# Patient Record
Sex: Male | Born: 1990 | Race: Black or African American | Hispanic: No | Marital: Single | Smoking: Current every day smoker
Health system: Southern US, Community
[De-identification: ages and names within clinical notes are randomized; demographics above are authoritative.]

## PROBLEM LIST (undated history)

## (undated) DIAGNOSIS — A539 Syphilis, unspecified: Secondary | ICD-10-CM

## (undated) DIAGNOSIS — F909 Attention-deficit hyperactivity disorder, unspecified type: Secondary | ICD-10-CM

## (undated) DIAGNOSIS — G43909 Migraine, unspecified, not intractable, without status migrainosus: Secondary | ICD-10-CM

## (undated) HISTORY — DX: Attention-deficit hyperactivity disorder, unspecified type: F90.9

---

## 2012-04-05 ENCOUNTER — Ambulatory Visit (INDEPENDENT_AMBULATORY_CARE_PROVIDER_SITE_OTHER): Payer: BC Managed Care – PPO | Admitting: Family Medicine

## 2012-04-05 VITALS — BP 123/72 | HR 56 | Temp 98.1°F | Resp 16 | Ht 68.5 in | Wt 162.6 lb

## 2012-04-05 DIAGNOSIS — F909 Attention-deficit hyperactivity disorder, unspecified type: Secondary | ICD-10-CM

## 2012-04-05 MED ORDER — METHYLPHENIDATE HCL 10 MG PO TABS: 10.0000 mg | ORAL_TABLET | Freq: Two times a day (BID) | ORAL | Status: DC

## 2012-04-05 NOTE — Patient Instructions (Signed)
Attention Deficit Hyperactivity Disorder Attention deficit hyperactivity disorder (ADHD) is a problem with behavior issues based on the way the brain functions (neurobehavioral disorder). It is a common reason for behavior and academic problems in school. CAUSES  The cause of ADHD is unknown in most cases. It may run in families. It sometimes can be associated with learning disabilities and other behavioral problems. SYMPTOMS  There are 3 types of ADHD. The 3 types and some of the symptoms include:  Inattentive   Gets bored or distracted easily.   Loses or forgets things. Forgets to hand in homework.   Has trouble organizing or completing tasks.   Difficulty staying on task.   An inability to organize daily tasks and school work.   Leaving projects, chores, or homework unfinished.   Trouble paying attention or responding to details. Careless mistakes.   Difficulty following directions. Often seems like is not listening.   Dislikes activities that require sustained attention (like chores or homework).   Hyperactive-impulsive   Feels like it is impossible to sit still or stay in a seat. Fidgeting with hands and feet.   Trouble waiting turn.   Talking too much or out of turn. Interruptive.   Speaks or acts impulsively.   Aggressive, disruptive behavior.   Constantly busy or on the go, noisy.   Combined   Has symptoms of both of the above.  Often children with ADHD feel discouraged about themselves and with school. They often perform well below their abilities in school. These symptoms can cause problems in home, school, and in relationships with peers. As children get older, the excess motor activities can calm down, but the problems with paying attention and staying organized persist. Most children do not outgrow ADHD but with good treatment can learn to cope with the symptoms. DIAGNOSIS  When ADHD is suspected, the diagnosis should be made by professionals trained in  ADHD.  Diagnosis will include:  Ruling out other reasons for the child's behavior.   The caregivers will check with the child's school and check their medical records.   They will talk to teachers and parents.   Behavior rating scales for the child will be filled out by those dealing with the child on a daily basis.  A diagnosis is made only after all information has been considered. TREATMENT  Treatment usually includes behavioral treatment often along with medicines. It may include stimulant medicines. The stimulant medicines decrease impulsivity and hyperactivity and increase attention. Other medicines used include antidepressants and certain blood pressure medicines. Most experts agree that treatment for ADHD should address all aspects of the child's functioning. Treatment should not be limited to the use of medicines alone. Treatment should include structured classroom management. The parents must receive education to address rewarding good behavior, discipline, and limit-setting. Tutoring or behavioral therapy or both should be available for the child. If untreated, the disorder can have long-term serious effects into adolescence and adulthood. HOME CARE INSTRUCTIONS   Often with ADHD there is a lot of frustration among the family in dealing with the illness. There is often blame and anger that is not warranted. This is a life long illness. There is no way to prevent ADHD. In many cases, because the problem affects the family as a whole, the entire family may need help. A therapist can help the family find better ways to handle the disruptive behaviors and promote change. If the child is young, most of the therapist's work is with the parents. Parents will   learn techniques for coping with and improving their child's behavior. Sometimes only the child with the ADHD needs counseling. Your caregivers can help you make these decisions.   Children with ADHD may need help in organizing. Some  helpful tips include:   Keep routines the same every day from wake-up time to bedtime. Schedule everything. This includes homework and playtime. This should include outdoor and indoor recreation. Keep the schedule on the refrigerator or a bulletin board where it is frequently seen. Mark schedule changes as far in advance as possible.   Have a place for everything and keep everything in its place. This includes clothing, backpacks, and school supplies.   Encourage writing down assignments and bringing home needed books.   Offer your child a well-balanced diet. Breakfast is especially important for school performance. Children should avoid drinks with caffeine including:   Soft drinks.   Coffee.   Tea.   However, some older children (adolescents) may find these drinks helpful in improving their attention.   Children with ADHD need consistent rules that they can understand and follow. If rules are followed, give small rewards. Children with ADHD often receive, and expect, criticism. Look for good behavior and praise it. Set realistic goals. Give clear instructions. Look for activities that can foster success and self-esteem. Make time for pleasant activities with your child. Give lots of affection.   Parents are their children's greatest advocates. Learn as much as possible about ADHD. This helps you become a stronger and better advocate for your child. It also helps you educate your child's teachers and instructors if they feel inadequate in these areas. Parent support groups are often helpful. A national group with local chapters is called CHADD (Children and Adults with Attention Deficit Hyperactivity Disorder).  PROGNOSIS  There is no cure for ADHD. Children with the disorder seldom outgrow it. Many find adaptive ways to accommodate the ADHD as they mature. SEEK MEDICAL CARE IF:  Your child has repeated muscle twitches, cough or speech outbursts.   Your child has sleep problems.   Your  child has a marked loss of appetite.   Your child develops depression.   Your child has new or worsening behavioral problems.   Your child develops dizziness.   Your child has a racing heart.   Your child has stomach pains.   Your child develops headaches.  Document Released: 12/03/2002 Document Revised: 12/02/2011 Document Reviewed: 07/15/2008 Exit[employer] Patient Information 2012 [employer]. 

## 2012-04-05 NOTE — Progress Notes (Signed)
21 yo who was diagnosed by [facility] with ADD and ADHD and took ritalin.  He gradually discontinued the Ritalin in high school Now he is studying criminal justice for ultimately getting a law degree.  He finds his mind wandering with difficulty focusing.  He is sleeping 6-7 hours.  He works at [employer] 25 hours a week.  His exercise consists of walking from work to class, etc.  O:  NAD Heart:  Reg, no murmur  A:  Bored to death.  H/O ADHD  P: Ritalin rx given for prn use

## 2012-04-05 NOTE — Progress Notes (Signed)
  Subjective:    Patient ID: Edward Dunn., male    DOB: 03/24/1991, 21 y.o.   MRN: [id number]  HPI    Review of Systems     Objective:   Physical Exam        Assessment & Plan:

## 2012-04-07 ENCOUNTER — Telehealth: Payer: Self-pay | Admitting: Internal Medicine

## 2012-04-07 NOTE — Telephone Encounter (Signed)
Pt states that he was in two days ago and now needs a note for school stating that he was diagnosed with ADHD. Please fax to mom Eleanora Neighbor at [phone]

## 2012-04-10 NOTE — Telephone Encounter (Signed)
I did not diagnose ADHD.  This was diagnosed by his doctors in [place].  I do not have their records, but agreed to refill the medicine based on what he told me.

## 2012-04-10 NOTE — Telephone Encounter (Signed)
Dr. Milus Glazier, please read Mother's request and write note if you think it is appropriate.

## 2012-04-11 NOTE — Telephone Encounter (Signed)
Called mom fax machine came on. Second time busy, unable to leave message

## 2012-04-12 NOTE — Telephone Encounter (Signed)
LMOM WITH NOTE FROM DR Milus Glazier

## 2012-05-23 ENCOUNTER — Telehealth: Payer: Self-pay

## 2012-05-23 MED ORDER — METHYLPHENIDATE HCL 10 MG PO TABS: 10.0000 mg | ORAL_TABLET | Freq: Two times a day (BID) | ORAL | Status: DC

## 2012-05-23 NOTE — Telephone Encounter (Signed)
rx printed.  Please advise patient that he'll need a follow-up for the next fill.

## 2012-05-23 NOTE — Telephone Encounter (Signed)
PT REQUESTING RITALIN REFILL  BEST PHONE [phone]

## 2012-05-24 NOTE — Telephone Encounter (Signed)
Patients had a voicemail that is not set up yet.Marland KitchenMarland KitchenMarland Kitchen

## 2012-05-25 NOTE — Telephone Encounter (Signed)
Notified pt that Rx is ready and need for f/up. Pt agreed.

## 2012-08-02 ENCOUNTER — Telehealth: Payer: Self-pay

## 2012-08-02 NOTE — Telephone Encounter (Signed)
Patient is requesting refill of Ritalin rx.  The patient would like to know if he needs to have an office visit to get rx or if it can be called into pharmacy without office visit.  Please call the patient at [phone].

## 2012-08-02 NOTE — Telephone Encounter (Signed)
Pt advised needs office visit

## 2012-09-04 ENCOUNTER — Telehealth: Payer: Self-pay

## 2012-09-04 NOTE — Telephone Encounter (Signed)
Pt requesting ritalin refills  Call [phone] when ready for pick up

## 2012-09-04 NOTE — Telephone Encounter (Signed)
In May, I refilled the medication and advised him he needed an OV for additional fills.  It looks like we denied his request for refill last month.  He really needs to be seen.  Hwat's the plan?  Is he getting it filled elsewhere?

## 2012-09-04 NOTE — Telephone Encounter (Signed)
Called patient to advise he needs office visit he agrees and will come in.

## 2012-09-05 ENCOUNTER — Ambulatory Visit (INDEPENDENT_AMBULATORY_CARE_PROVIDER_SITE_OTHER): Payer: BC Managed Care – PPO | Admitting: Emergency Medicine

## 2012-09-05 VITALS — BP 114/61 | HR 58 | Temp 98.6°F | Resp 16 | Ht 70.0 in | Wt 157.0 lb

## 2012-09-05 DIAGNOSIS — G44009 Cluster headache syndrome, unspecified, not intractable: Secondary | ICD-10-CM

## 2012-09-05 DIAGNOSIS — A630 Anogenital (venereal) warts: Secondary | ICD-10-CM

## 2012-09-05 DIAGNOSIS — F988 Other specified behavioral and emotional disorders with onset usually occurring in childhood and adolescence: Secondary | ICD-10-CM

## 2012-09-05 DIAGNOSIS — K5900 Constipation, unspecified: Secondary | ICD-10-CM

## 2012-09-05 MED ORDER — METHYLPHENIDATE HCL 10 MG PO TABS: 10.0000 mg | ORAL_TABLET | Freq: Two times a day (BID) | ORAL | Status: DC

## 2012-09-05 MED ORDER — BUTALBITAL-APAP-CAFFEINE 50-325-40 MG PO TABS: 1.0000 | ORAL_TABLET | Freq: Four times a day (QID) | ORAL | Status: AC | PRN

## 2012-09-05 MED ORDER — POLYETHYLENE GLYCOL 3350 17 GM/SCOOP PO POWD: 17.0000 g | Freq: Two times a day (BID) | ORAL | Status: AC | PRN
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
## 2013-12-10 ENCOUNTER — Encounter (HOSPITAL_BASED_OUTPATIENT_CLINIC_OR_DEPARTMENT_OTHER): Payer: Self-pay | Admitting: Emergency Medicine

## 2013-12-10 ENCOUNTER — Emergency Department (HOSPITAL_BASED_OUTPATIENT_CLINIC_OR_DEPARTMENT_OTHER)
Admission: EM | Admit: 2013-12-10 | Discharge: 2013-12-10 | Disposition: A | Discharge status: Home / Self Care | Payer: BC Managed Care – PPO | Attending: Emergency Medicine | Admitting: Emergency Medicine

## 2013-12-10 DIAGNOSIS — F17200 Nicotine dependence, unspecified, uncomplicated: Secondary | ICD-10-CM | POA: Insufficient documentation

## 2013-12-10 DIAGNOSIS — G43909 Migraine, unspecified, not intractable, without status migrainosus: Secondary | ICD-10-CM | POA: Insufficient documentation

## 2013-12-10 HISTORY — DX: Migraine, unspecified, not intractable, without status migrainosus: G43.909

## 2013-12-10 MED ORDER — METOCLOPRAMIDE HCL 5 MG/ML IJ SOLN
10.0000 mg | Freq: Once | INTRAMUSCULAR | Status: AC
  Administered 2013-12-10: 10 mg via INTRAVENOUS
  Filled 2013-12-10: qty 2

## 2013-12-10 NOTE — ED Provider Notes (Signed)
CSN: [id number]     Arrival date & time 12/10/13  1635 History  This chart was scribed for Doug Sou, MD by Blanchard Kelch, ED Scribe. The patient was seen in room MH07/MH07. Patient's care was started at 5:46 PM.     Chief Complaint  Patient presents with  . Migraine   Patient is a 22 y.o. male presenting with migraines. The history is provided by the patient. No language interpreter was used.  Migraine This is a new problem. The current episode started yesterday. The problem occurs constantly. The problem has not changed since onset.Associated symptoms include headaches. Exacerbated by: light. Nothing relieves the symptoms.    HPI Comments: Edward Dunn. is a 22 y.o. male who presents to the Emergency Department complaining of a migraine to the side of his head bilaterally that began last night. He states that he took two Tylenol PM and then slept but the headache came back after waking and has not subsided throughout the day, even after taking a nap. The pain is currently a 7/10. He has associated photophobia with the migraine. He denies nausea. He was diagnosed with migraines as a child. His last similar headache was over a year ago. He is a current smoker and social alcohol user. He denies illegal drug use.     Past Medical History  Diagnosis Date  . Migraine    History reviewed. No pertinent past surgical history. No family history on file. History  Substance Use Topics  . Smoking status: Current Every Day Smoker  . Smokeless tobacco: Not on file  . Alcohol Use: Yes    Review of Systems  Constitutional: Negative.   Eyes: Positive for photophobia.  Respiratory: Negative.   Cardiovascular: Negative.   Gastrointestinal: Negative.  Negative for nausea.  Musculoskeletal: Negative.   Skin: Negative.   Allergic/Immunologic: Negative.   Neurological: Positive for headaches.  Psychiatric/Behavioral: Negative.     Allergies  Review of patient's allergies indicates no  known allergies.  Home Medications   Current Outpatient Rx  Name  Route  Sig  Dispense  Refill  . EXPIRED: methylphenidate (RITALIN) 10 MG tablet   Oral   Take 1 tablet (10 mg total) by mouth 2 (two) times daily.   60 tablet   0   . EXPIRED: methylphenidate (RITALIN) 10 MG tablet   Oral   Take 1 tablet (10 mg total) by mouth 2 (two) times daily.   60 tablet   0   . EXPIRED: methylphenidate (RITALIN) 10 MG tablet   Oral   Take 1 tablet (10 mg total) by mouth 2 (two) times daily.   60 tablet   0    Triage Vitals: BP 127/66  Pulse 78  Temp(Src) 98.5 F (36.9 C) (Oral)  Resp 16  Ht 6' (1.829 m)  Wt 160 lb (72.576 kg)  BMI 21.70 kg/m2  SpO2 98%  Physical Exam  Nursing note and vitals reviewed. Constitutional: He appears well-developed and well-nourished.  HENT:  Head: Normocephalic and atraumatic.  Eyes: Conjunctivae are normal. Pupils are equal, round, and reactive to light.  Optic discs sharp.  Neck: Neck supple. No tracheal deviation present. No thyromegaly present.  Cardiovascular: Normal rate and regular rhythm.   No murmur heard. Pulmonary/Chest: Effort normal and breath sounds normal.  Abdominal: Soft. Bowel sounds are normal. He exhibits no distension. There is no tenderness.  Musculoskeletal: Normal range of motion. He exhibits no edema and no tenderness.  Neurological: He is alert. Coordination normal.  Skin: Skin is warm and dry. No rash noted.  Psychiatric: He has a normal mood and affect.    ED Course  Procedures (including critical care time)  DIAGNOSTIC STUDIES: Oxygen Saturation is 98% on room air, normal by my interpretation.    COORDINATION OF CARE: 5:51 PM -He states he was driven here by someone. Patient verbalizes understanding and agrees with treatment plan.    Labs Review Labs Reviewed - No data to display Imaging Review No results found.  EKG Interpretation   None      7: 50 PM pain improved after treatment with intravenous  Reglan. Patient alert Glasgow Coma Score 15 no distress MDM  No diagnosis found.  Plan referral trauma center referral resource guide her primary care physician Diagnosis migraine headache   I personally performed the services described in this documentation, which was scribed in my presence. The recorded information has been reviewed and considered.   Doug Sou, MD 12/10/13 [phone]

## 2013-12-10 NOTE — ED Notes (Signed)
Pt reports onset of a headache that is described as bilateral and started after having his hair "weaved" yesterday.  Pain is associated with photophobia and unrelieved after taking Excedrin migraine.  Denies recent head trauma and weakness.

## 2013-12-10 NOTE — ED Notes (Signed)
C/o migraine HA since last night-reports migraine dx at childhood-NAD noted at this time

## 2013-12-10 NOTE — ED Notes (Signed)
Family at bedside. 

## 2013-12-31 ENCOUNTER — Telehealth: Payer: Self-pay

## 2013-12-31 NOTE — Telephone Encounter (Signed)
Pt states that he needs a letter for school stating that he has adhd and that he takes medication. He also will need the note to state that he needs additional assistance for classes because he has failed 2 already. Best#[phone]

## 2014-01-01 ENCOUNTER — Ambulatory Visit (INDEPENDENT_AMBULATORY_CARE_PROVIDER_SITE_OTHER): Payer: BC Managed Care – PPO | Admitting: Emergency Medicine

## 2014-01-01 VITALS — BP 98/66 | HR 63 | Temp 98.2°F | Resp 18 | Ht 72.0 in | Wt 169.0 lb

## 2014-01-01 DIAGNOSIS — F988 Other specified behavioral and emotional disorders with onset usually occurring in childhood and adolescence: Secondary | ICD-10-CM

## 2014-01-01 MED ORDER — METHYLPHENIDATE HCL 10 MG PO TABS: 10.0000 mg | ORAL_TABLET | Freq: Two times a day (BID) | ORAL | Status: DC

## 2014-01-01 NOTE — Progress Notes (Signed)
[facility] [address], [place] [id number] [phone]  Date:  01/01/2014   Name:  Edward KaysMarcus E Portee Jr.   DOB:  Jul 16, 1991   MRN:  [id number]  PCP:  No PCP Per Patient    Chief Complaint: rx refills   History of Present Illness:  Edward KaysMarcus E Grine Jr. is a 23 y.o. very pleasant male patient who presents with the following:  Seen in 2013 and started on ritalin by Dr Milus GlazierLauenstein.  Was in college and stopped attending.  Now has returned for a refill as he is returning to school. 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  No improvement with over the counter medications or other home remedies. Denies other complaint or health concern today.   There are no active problems to display for this patient.   Past Medical History  Diagnosis Date  . Migraine   . ADHD (attention deficit hyperactivity disorder)     History reviewed. No pertinent past surgical history.  History  Substance Use Topics  . Smoking status: Former Games developermoker  . Smokeless tobacco: Not on file  . Alcohol Use: Yes    History reviewed. No pertinent family history.  No Known Allergies  Medication list has been reviewed and updated.  Current Outpatient Prescriptions on File Prior to Visit  Medication Sig Dispense Refill  . methylphenidate (RITALIN) 10 MG tablet Take 1 tablet (10 mg total) by mouth 2 (two) times daily.  60 tablet  0  . methylphenidate (RITALIN) 10 MG tablet Take 1 tablet (10 mg total) by mouth 2 (two) times daily.  60 tablet  0  . methylphenidate (RITALIN) 10 MG tablet Take 1 tablet (10 mg total) by mouth 2 (two) times daily.  60 tablet  0   No current facility-administered medications on file prior to visit.    Review of Systems:  As per HPI, otherwise negative.    Physical Examination: Filed Vitals:   01/01/14 0948  BP: 98/66  Pulse: 63  Temp: 98.2 F (36.8 C)  Resp: 18   Filed Vitals:   01/01/14 0948  Height: 6' (1.829 m)  Weight: 169 lb (76.658 kg)   Body mass index is 22.92 kg/(m^2). Ideal Body Weight: Weight in (lb) to have BMI = 25: 183.9   GEN: WDWN, NAD, Non-toxic, Alert & Oriented x 3 HEENT: Atraumatic, Normocephalic.  Ears and Nose: No external deformity. EXTR: No clubbing/cyanosis/edema NEURO: Normal gait.  PSYCH: Normally interactive. Conversant. Not depressed or anxious appearing.  Calm demeanor.    Assessment and Plan: ADDHD Refill ritalin.   Signed,  Phillips OdorJeffery Kyshon Tolliver, MD

## 2014-01-01 NOTE — Patient Instructions (Signed)
Attention Deficit Hyperactivity Disorder Attention deficit hyperactivity disorder (ADHD) is a problem with behavior issues based on the way the brain functions (neurobehavioral disorder). It is a common reason for behavior and academic problems in school. CAUSES  The cause of ADHD is unknown in most cases. It may run in families. It sometimes can be associated with learning disabilities and other behavioral problems. SYMPTOMS  There are 3 types of ADHD. The 3 types and some of the symptoms include:  Inattentive  Gets bored or distracted easily.  Loses or forgets things. Forgets to hand in homework.  Has trouble organizing or completing tasks.  Difficulty staying on task.  An inability to organize daily tasks and school work.  Leaving projects, chores, or homework unfinished.  Trouble paying attention or responding to details. Careless mistakes.  Difficulty following directions. Often seems like is not listening.  Dislikes activities that require sustained attention (like chores or homework).  Hyperactive-impulsive  Feels like it is impossible to sit still or stay in a seat. Fidgeting with hands and feet.  Trouble waiting turn.  Talking too much or out of turn. Interruptive.  Speaks or acts impulsively.  Aggressive, disruptive behavior.  Constantly busy or on the go, noisy.  Combined  Has symptoms of both of the above. Often children with ADHD feel discouraged about themselves and with school. They often perform well below their abilities in school. These symptoms can cause problems in home, school, and in relationships with peers. As children get older, the excess motor activities can calm down, but the problems with paying attention and staying organized persist. Most children do not outgrow ADHD but with good treatment can learn to cope with the symptoms. DIAGNOSIS  When ADHD is suspected, the diagnosis should be made by professionals trained in ADHD.  Diagnosis will  include:  Ruling out other reasons for the child's behavior.  The caregivers will check with the child's school and check their medical records.  They will talk to teachers and parents.  Behavior rating scales for the child will be filled out by those dealing with the child on a daily basis. A diagnosis is made only after all information has been considered. TREATMENT  Treatment usually includes behavioral treatment often along with medicines. It may include stimulant medicines. The stimulant medicines decrease impulsivity and hyperactivity and increase attention. Other medicines used include antidepressants and certain blood pressure medicines. Most experts agree that treatment for ADHD should address all aspects of the child's functioning. Treatment should not be limited to the use of medicines alone. Treatment should include structured classroom management. The parents must receive education to address rewarding good behavior, discipline, and limit-setting. Tutoring or behavioral therapy or both should be available for the child. If untreated, the disorder can have long-term serious effects into adolescence and adulthood. HOME CARE INSTRUCTIONS   Often with ADHD there is a lot of frustration among the family in dealing with the illness. There is often blame and anger that is not warranted. This is a life long illness. There is no way to prevent ADHD. In many cases, because the problem affects the family as a whole, the entire family may need help. A therapist can help the family find better ways to handle the disruptive behaviors and promote change. If the child is young, most of the therapist's work is with the parents. Parents will learn techniques for coping with and improving their child's behavior. Sometimes only the child with the ADHD needs counseling. Your caregivers can help   you make these decisions.  Children with ADHD may need help in organizing. Some helpful tips include:  Keep  routines the same every day from wake-up time to bedtime. Schedule everything. This includes homework and playtime. This should include outdoor and indoor recreation. Keep the schedule on the refrigerator or a bulletin board where it is frequently seen. Mark schedule changes as far in advance as possible.  Have a place for everything and keep everything in its place. This includes clothing, backpacks, and school supplies.  Encourage writing down assignments and bringing home needed books.  Offer your child a well-balanced diet. Breakfast is especially important for school performance. Children should avoid drinks with caffeine including:  Soft drinks.  Coffee.  Tea.  However, some older children (adolescents) may find these drinks helpful in improving their attention.  Children with ADHD need consistent rules that they can understand and follow. If rules are followed, give small rewards. Children with ADHD often receive, and expect, criticism. Look for good behavior and praise it. Set realistic goals. Give clear instructions. Look for activities that can foster success and self-esteem. Make time for pleasant activities with your child. Give lots of affection.  Parents are their children's greatest advocates. Learn as much as possible about ADHD. This helps you become a stronger and better advocate for your child. It also helps you educate your child's teachers and instructors if they feel inadequate in these areas. Parent support groups are often helpful. A national group with local chapters is called CHADD (Children and Adults with Attention Deficit Hyperactivity Disorder). PROGNOSIS  There is no cure for ADHD. Children with the disorder seldom outgrow it. Many find adaptive ways to accommodate the ADHD as they mature. SEEK MEDICAL CARE IF:  Your child has repeated muscle twitches, cough or speech outbursts.  Your child has sleep problems.  Your child has a marked loss of  appetite.  Your child develops depression.  Your child has new or worsening behavioral problems.  Your child develops dizziness.  Your child has a racing heart.  Your child has stomach pains.  Your child develops headaches. Document Released: 12/03/2002 Document Revised: 03/06/2012 Document Reviewed: 07/04/2013 Exit[employer] Patient Information 2014 [employer].  

## 2014-01-01 NOTE — Telephone Encounter (Signed)
Do you want him to come in? Or can you provide letter? last here 08/2012,

## 2014-06-07 ENCOUNTER — Emergency Department (HOSPITAL_BASED_OUTPATIENT_CLINIC_OR_DEPARTMENT_OTHER)
Admission: EM | Admit: 2014-06-07 | Discharge: 2014-06-07 | Disposition: A | Discharge status: Home / Self Care | Payer: BC Managed Care – PPO | Attending: Emergency Medicine | Admitting: Emergency Medicine

## 2014-06-07 ENCOUNTER — Encounter (HOSPITAL_BASED_OUTPATIENT_CLINIC_OR_DEPARTMENT_OTHER): Payer: Self-pay | Admitting: Emergency Medicine

## 2014-06-07 DIAGNOSIS — Z8679 Personal history of other diseases of the circulatory system: Secondary | ICD-10-CM | POA: Insufficient documentation

## 2014-06-07 DIAGNOSIS — Z79899 Other long term (current) drug therapy: Secondary | ICD-10-CM | POA: Insufficient documentation

## 2014-06-07 DIAGNOSIS — F17200 Nicotine dependence, unspecified, uncomplicated: Secondary | ICD-10-CM | POA: Insufficient documentation

## 2014-06-07 DIAGNOSIS — F909 Attention-deficit hyperactivity disorder, unspecified type: Secondary | ICD-10-CM | POA: Insufficient documentation

## 2014-06-07 DIAGNOSIS — J069 Acute upper respiratory infection, unspecified: Secondary | ICD-10-CM | POA: Insufficient documentation

## 2014-06-07 MED ORDER — GUAIFENESIN-CODEINE 100-10 MG/5ML PO SOLN: 10.0000 mL | Freq: Four times a day (QID) | ORAL | Status: DC | PRN

## 2014-06-07 NOTE — ED Notes (Signed)
Cough and congestion x 2 days.  Throat burns with coughing.  Taking Mucinex without relief.  No sick contacts, no fevers.

## 2014-06-07 NOTE — ED Provider Notes (Signed)
CSN: [id number]     Arrival date & time 06/07/14  1015 History   First MD Initiated Contact with Patient 06/07/14 1039     Chief Complaint  Patient presents with  . Cough     (Consider location/radiation/quality/duration/timing/severity/associated sxs/prior Treatment) HPI Comments: Patient is a 23 year old male with no significant past medical history. Presents with complaints of chest congestion and cough for the past 2 days. His cough has been nonproductive. Been taking over-the-counter medications with no relief. He denies any chest pain or shortness of breath. He has not had any fevers. He denies any sick contacts.  Patient is a 23 y.o. male presenting with cough. The history is provided by the patient.  Cough Cough characteristics:  Non-productive Severity:  Moderate Onset quality:  Sudden Duration:  3 days Timing:  Constant Progression:  Worsening Chronicity:  New Smoker: yes   Relieved by:  Nothing Worsened by:  Nothing tried Ineffective treatments:  None tried Associated symptoms: no chest pain, no fever and no sore throat     Past Medical History  Diagnosis Date  . Migraine   . ADHD (attention deficit hyperactivity disorder)    History reviewed. No pertinent past surgical history. No family history on file. History  Substance Use Topics  . Smoking status: Current Every Day Smoker -- 0.50 packs/day    Types: Cigarettes  . Smokeless tobacco: Not on file  . Alcohol Use: Yes     Comment: social    Review of Systems  Constitutional: Negative for fever.  HENT: Negative for sore throat.   Respiratory: Positive for cough.   Cardiovascular: Negative for chest pain.  All other systems reviewed and are negative.     Allergies  Review of patient's allergies indicates no known allergies.  Home Medications   Prior to Admission medications   Medication Sig Start Date End Date Taking? Authorizing Provider  methylphenidate (RITALIN) 10 MG tablet Take 1 tablet (10  mg total) by mouth 2 (two) times daily. 01/01/14 04/29/15  Phillips OdorJeffery Anderson, MD  methylphenidate (RITALIN) 10 MG tablet Take 1 tablet (10 mg total) by mouth 2 (two) times daily. Do not fill before 02/01/14 02/01/14   Phillips OdorJeffery Anderson, MD  methylphenidate (RITALIN) 10 MG tablet Take 1 tablet (10 mg total) by mouth 2 (two) times daily. Do not fill before 03/01/14 03/01/14   Phillips OdorJeffery Anderson, MD   BP 120/63  Pulse 57  Temp(Src) 97.9 F (36.6 C) (Oral)  Resp 16  Ht 6' (1.829 m)  Wt 145 lb (65.772 kg)  BMI 19.66 kg/m2  SpO2 100% Physical Exam  Nursing note and vitals reviewed. Constitutional: He is oriented to person, place, and time. He appears well-developed and well-nourished. No distress.  HENT:  Head: Normocephalic and atraumatic.  Mouth/Throat: Oropharynx is clear and moist.  TMs are clear bilaterally.  Neck: Normal range of motion. Neck supple.  Cardiovascular: Normal rate, regular rhythm and normal heart sounds.   No murmur heard. Pulmonary/Chest: Effort normal and breath sounds normal. No respiratory distress. He has no wheezes.  Abdominal: Soft. Bowel sounds are normal. He exhibits no distension. There is no tenderness.  Musculoskeletal: Normal range of motion. He exhibits no edema.  Lymphadenopathy:    He has no cervical adenopathy.  Neurological: He is alert and oriented to person, place, and time.  Skin: Skin is warm and dry. He is not diaphoretic.    ED Course  Procedures (including critical care time) Labs Review Labs Reviewed - No data to display  Imaging  Review No results found.   EKG Interpretation None      MDM   Final diagnoses:  None    Symptoms likely viral in nature. His lungs are clear and oxygen saturations are 100%. We'll treat with Robitussin with codeine and when necessary followup.    Geoffery Lyonsouglas Alireza Pollack, MD 06/07/14 1047

## 2014-06-07 NOTE — Discharge Instructions (Signed)
Robitussin with codeine as prescribed as needed for cough.  Tylenol and Motrin as needed for fever.  Return to the emergency department if you develop severe chest pain or difficulty breathing.   Upper Respiratory Infection, Adult An upper respiratory infection (URI) is also sometimes known as the common cold. The upper respiratory tract includes the nose, sinuses, throat, trachea, and bronchi. Bronchi are the airways leading to the lungs. Most people improve within 1 week, but symptoms can last up to 2 weeks. A residual cough may last even longer.  CAUSES Many different viruses can infect the tissues lining the upper respiratory tract. The tissues become irritated and inflamed and often become very moist. Mucus production is also common. A cold is contagious. You can easily spread the virus to others by oral contact. This includes kissing, sharing a glass, coughing, or sneezing. Touching your mouth or nose and then touching a surface, which is then touched by another person, can also spread the virus. SYMPTOMS  Symptoms typically develop 1 to 3 days after you come in contact with a cold virus. Symptoms vary from person to person. They may include:  Runny nose.  Sneezing.  Nasal congestion.  Sinus irritation.  Sore throat.  Loss of voice (laryngitis).  Cough.  Fatigue.  Muscle aches.  Loss of appetite.  Headache.  Low-grade fever. DIAGNOSIS  You might diagnose your own cold based on familiar symptoms, since most people get a cold 2 to 3 times a year. Your caregiver can confirm this based on your exam. Most importantly, your caregiver can check that your symptoms are not due to another disease such as strep throat, sinusitis, pneumonia, asthma, or epiglottitis. Blood tests, throat tests, and X-rays are not necessary to diagnose a common cold, but they may sometimes be helpful in excluding other more serious diseases. Your caregiver will decide if any further tests are  required. RISKS AND COMPLICATIONS  You may be at risk for a more severe case of the common cold if you smoke cigarettes, have chronic heart disease (such as heart failure) or lung disease (such as asthma), or if you have a weakened immune system. The very young and very old are also at risk for more serious infections. Bacterial sinusitis, middle ear infections, and bacterial pneumonia can complicate the common cold. The common cold can worsen asthma and chronic obstructive pulmonary disease (COPD). Sometimes, these complications can require emergency medical care and may be life-threatening. PREVENTION  The best way to protect against getting a cold is to practice good hygiene. Avoid oral or hand contact with people with cold symptoms. Wash your hands often if contact occurs. There is no clear evidence that vitamin C, vitamin E, echinacea, or exercise reduces the chance of developing a cold. However, it is always recommended to get plenty of rest and practice good nutrition. TREATMENT  Treatment is directed at relieving symptoms. There is no cure. Antibiotics are not effective, because the infection is caused by a virus, not by bacteria. Treatment may include:  Increased fluid intake. Sports drinks offer valuable electrolytes, sugars, and fluids.  Breathing heated mist or steam (vaporizer or shower).  Eating chicken soup or other clear broths, and maintaining good nutrition.  Getting plenty of rest.  Using gargles or lozenges for comfort.  Controlling fevers with ibuprofen or acetaminophen as directed by your caregiver.  Increasing usage of your inhaler if you have asthma. Zinc gel and zinc lozenges, taken in the first 24 hours of the common cold, can shorten  the duration and lessen the severity of symptoms. Pain medicines may help with fever, muscle aches, and throat pain. A variety of non-prescription medicines are available to treat congestion and runny nose. Your caregiver can make  recommendations and may suggest nasal or lung inhalers for other symptoms.  HOME CARE INSTRUCTIONS   Only take over-the-counter or prescription medicines for pain, discomfort, or fever as directed by your caregiver.  Use a warm mist humidifier or inhale steam from a shower to increase air moisture. This may keep secretions moist and make it easier to breathe.  Drink enough water and fluids to keep your urine clear or pale yellow.  Rest as needed.  Return to work when your temperature has returned to normal or as your caregiver advises. You may need to stay home longer to avoid infecting others. You can also use a face mask and careful hand washing to prevent spread of the virus. SEEK MEDICAL CARE IF:   After the first few days, you feel you are getting worse rather than better.  You need your caregiver's advice about medicines to control symptoms.  You develop chills, worsening shortness of breath, or brown or red sputum. These may be signs of pneumonia.  You develop yellow or brown nasal discharge or pain in the face, especially when you bend forward. These may be signs of sinusitis.  You develop a fever, swollen neck glands, pain with swallowing, or white areas in the back of your throat. These may be signs of strep throat. SEEK IMMEDIATE MEDICAL CARE IF:   You have a fever.  You develop severe or persistent headache, ear pain, sinus pain, or chest pain.  You develop wheezing, a prolonged cough, cough up blood, or have a change in your usual mucus (if you have chronic lung disease).  You develop sore muscles or a stiff neck. Document Released: 06/08/2001 Document Revised: 03/06/2012 Document Reviewed: 04/16/2011 [facility] Patient Information 2014 [place], [place].

## 2014-07-31 ENCOUNTER — Encounter (HOSPITAL_BASED_OUTPATIENT_CLINIC_OR_DEPARTMENT_OTHER): Payer: Self-pay | Admitting: Emergency Medicine

## 2014-07-31 ENCOUNTER — Emergency Department (HOSPITAL_BASED_OUTPATIENT_CLINIC_OR_DEPARTMENT_OTHER)
Admission: EM | Admit: 2014-07-31 | Discharge: 2014-07-31 | Disposition: A | Discharge status: Home / Self Care | Payer: BC Managed Care – PPO | Attending: Emergency Medicine | Admitting: Emergency Medicine

## 2014-07-31 DIAGNOSIS — F17200 Nicotine dependence, unspecified, uncomplicated: Secondary | ICD-10-CM | POA: Insufficient documentation

## 2014-07-31 DIAGNOSIS — K029 Dental caries, unspecified: Secondary | ICD-10-CM | POA: Insufficient documentation

## 2014-07-31 DIAGNOSIS — K0889 Other specified disorders of teeth and supporting structures: Secondary | ICD-10-CM

## 2014-07-31 DIAGNOSIS — Z79899 Other long term (current) drug therapy: Secondary | ICD-10-CM | POA: Insufficient documentation

## 2014-07-31 DIAGNOSIS — Z791 Long term (current) use of non-steroidal anti-inflammatories (NSAID): Secondary | ICD-10-CM | POA: Insufficient documentation

## 2014-07-31 DIAGNOSIS — F909 Attention-deficit hyperactivity disorder, unspecified type: Secondary | ICD-10-CM | POA: Insufficient documentation

## 2014-07-31 DIAGNOSIS — Z8679 Personal history of other diseases of the circulatory system: Secondary | ICD-10-CM | POA: Insufficient documentation

## 2014-07-31 DIAGNOSIS — K089 Disorder of teeth and supporting structures, unspecified: Secondary | ICD-10-CM | POA: Insufficient documentation

## 2014-07-31 MED ORDER — PENICILLIN V POTASSIUM 500 MG PO TABS: 500.0000 mg | ORAL_TABLET | Freq: Three times a day (TID) | ORAL | Status: DC

## 2014-07-31 MED ORDER — HYDROCODONE-ACETAMINOPHEN 5-325 MG PO TABS: ORAL_TABLET | ORAL | Status: DC

## 2014-07-31 MED ORDER — NAPROXEN 500 MG PO TABS: 500.0000 mg | ORAL_TABLET | Freq: Two times a day (BID) | ORAL | Status: DC

## 2014-07-31 MED ORDER — HYDROCODONE-ACETAMINOPHEN 5-325 MG PO TABS
1.0000 | ORAL_TABLET | Freq: Once | ORAL | Status: AC
  Administered 2014-07-31: 1 via ORAL
  Filled 2014-07-31: qty 1

## 2014-07-31 NOTE — Discharge Instructions (Signed)
Please read and follow all provided instructions.  Your diagnoses today include:  1. Pain, dental     The exam and treatment you received today has been provided on an emergency basis only. This is not a substitute for complete medical or dental care.  Tests performed today include:  Vital signs. See below for your results today.   Medications prescribed:   Vicodin (hydrocodone/acetaminophen) - narcotic pain medication  DO NOT drive or perform any activities that require you to be awake and alert because this medicine can make you drowsy. BE VERY CAREFUL not to take multiple medicines containing Tylenol (also called acetaminophen). Doing so can lead to an overdose which can damage your liver and cause liver failure and possibly death.   Naproxen - anti-inflammatory pain medication  Do not exceed 500mg  naproxen every 12 hours, take with food  You have been prescribed an anti-inflammatory medication or NSAID. Take with food. Take smallest effective dose for the shortest duration needed for your pain. Stop taking if you experience stomach pain or vomiting.    Penicillin - antibiotic  You have been prescribed an antibiotic medicine: take the entire course of medicine even if you are feeling better. Stopping early can cause the antibiotic not to work.  Take any prescribed medications only as directed.  Home care instructions:  Follow any educational materials contained in this packet.  Follow-up instructions: Please follow-up with your dentist for further evaluation of your symptoms.   Dental Assistance: See below for dental referrals  Return instructions:   Please return to the Emergency Department if you experience worsening symptoms.  Please return if you develop a fever, you develop more swelling in your face or neck, you have trouble breathing or swallowing food.  Please return if you have any other emergent concerns.  Additional Information:  Your vital signs today  were: BP 127/76   Pulse 52   Temp(Src) 98.2 F (36.8 C) (Oral)   Resp 18   Ht 6' (1.829 m)   Wt 145 lb (65.772 kg)   BMI 19.66 kg/m2   SpO2 100% If your blood pressure (BP) was elevated above 135/85 this visit, please have this repeated by your doctor within one month. -------------- Dental Care: Organization         Address  Phone  Notes  [facility] Department of [facility] [address] [place], [place] [phone] Accepts children up to age 22 who are enrolled in [place] or [place] Health Choice; pregnant women with a Medicaid card; and children who have applied for Medicaid or [place] Health Choice, but were declined, whose parents can pay a reduced fee at time of service.  [facility] Department of [facility]  [address], [place] [phone] Accepts children up to age 50 who are enrolled in [place] or [place] Health Choice; pregnant women with a Medicaid card; and children who have applied for Medicaid or [place] Health Choice, but were declined, whose parents can pay a reduced fee at time of service.  [facility]  [address] [place], [place] [phone] Patients are seen by appointment only. Walk-ins are not accepted. [facility] will see patients 76 years of age and older. Monday - Tuesday (8am-5pm) Most Wednesdays (8:30-5pm) $30 per visit, cash only  [facility]  [address], [facility] [phone] Patients are seen by appointment only. Walk-ins are not accepted. [facility] will see  patients 24 years of age and older. One Wednesday Evening (Monthly: Volunteer Based).  $30 per visit, cash only  [facility]  [phone] for adults; Children under age 154, call Graduate Pediatric Dentistry at [phone]. Children aged 884-14, please call [phone] to request a pediatric application.  Dental services are provided in all areas of dental care  including fillings, crowns and bridges, complete and partial dentures, implants, gum treatment, root canals, and extractions. Preventive care is also provided. Treatment is provided to both adults and children. Patients are selected via a lottery and there is often a waiting list.   [facility] [address], [place]  [phone] [email or web address]   [facility] [address], [place], [place] [phone], [phone]. [phone] Second and Fourth Thursday of each month, opens at 6:30 AM; Clinic ends at 9 AM.  Patients are seen on a first-come first-served basis, and a limited number are seen during each clinic.   [facility]  [address], [place], [place] [phone]   Eligibility Requirements You must have lived in [place], [place], or [place] counties for at least the last three months.   You cannot be eligible for state or federal sponsored [place] insurance, including [employer], [place], or [employer]are.   You generally cannot be eligible for healthcare insurance through your employer.    How to apply: Eligibility screenings are held every Tuesday and Wednesday afternoon from 1:00 pm until 4:00 pm. You do not need an appointment for the interview!  [facility] [address], [place], [place] [phone]   [facility]  [phone]   [facility]  [phone]   [facility]  [phone]

## 2014-07-31 NOTE — ED Provider Notes (Signed)
CSN: [id number]     Arrival date & time 07/31/14  1955 History   First MD Initiated Contact with Patient 07/31/14 2104     Chief Complaint  Patient presents with  . Dental Pain     (Consider location/radiation/quality/duration/timing/severity/associated sxs/prior Treatment) HPI Comments: Patient with R upper and lower dental pain x 3 days. Has taken OTC meds without relief. No facial swelling or neck swelling. No trouble breathing or swallowing. No history of dental problems. He has two previously broken teeth in mouth: right lower, left lower. The onset of this condition was acute. The course is constant. Aggravating factors: none. Alleviating factors: none.    The history is provided by the patient.    Past Medical History  Diagnosis Date  . Migraine   . ADHD (attention deficit hyperactivity disorder)    History reviewed. No pertinent past surgical history. No family history on file. History  Substance Use Topics  . Smoking status: Current Every Day Smoker -- 0.50 packs/day    Types: Cigarettes  . Smokeless tobacco: Not on file  . Alcohol Use: Yes     Comment: social    Review of Systems  Constitutional: Negative for fever.  HENT: Positive for dental problem. Negative for ear pain, facial swelling, sore throat and trouble swallowing.   Respiratory: Negative for shortness of breath and stridor.   Musculoskeletal: Negative for neck pain.  Skin: Negative for color change.  Neurological: Negative for headaches.      Allergies  Review of patient's allergies indicates no known allergies.  Home Medications   Prior to Admission medications   Medication Sig Start Date End Date Taking? Authorizing Provider  guaiFENesin-codeine 100-10 MG/5ML syrup Take 10 mLs by mouth every 6 (six) hours as needed for cough. 06/07/14   Geoffery Lyons, MD  HYDROcodone-acetaminophen (NORCO/VICODIN) 5-325 MG per tablet Take 1-2 tablets every 6 hours as needed for severe pain 07/31/14   Renne Crigler,  PA-C  methylphenidate (RITALIN) 10 MG tablet Take 1 tablet (10 mg total) by mouth 2 (two) times daily. 01/01/14 04/29/15  Phillips Odor, MD  methylphenidate (RITALIN) 10 MG tablet Take 1 tablet (10 mg total) by mouth 2 (two) times daily. Do not fill before 02/01/14 02/01/14   Phillips Odor, MD  methylphenidate (RITALIN) 10 MG tablet Take 1 tablet (10 mg total) by mouth 2 (two) times daily. Do not fill before 03/01/14 03/01/14   Phillips Odor, MD  naproxen (NAPROSYN) 500 MG tablet Take 1 tablet (500 mg total) by mouth 2 (two) times daily. 07/31/14   Renne Crigler, PA-C  penicillin v potassium (VEETID) 500 MG tablet Take 1 tablet (500 mg total) by mouth 3 (three) times daily. 07/31/14   Renne Crigler, PA-C   BP 127/76  Pulse 52  Temp(Src) 98.2 F (36.8 C) (Oral)  Resp 18  Ht 6' (1.829 m)  Wt 145 lb (65.772 kg)  BMI 19.66 kg/m2  SpO2 100% Physical Exam  Nursing note and vitals reviewed. Constitutional: He appears well-developed and well-nourished.  HENT:  Head: Normocephalic and atraumatic.  Right Ear: Tympanic membrane, external ear and ear canal normal.  Left Ear: Tympanic membrane, external ear and ear canal normal.  Nose: Nose normal.  Mouth/Throat: Uvula is midline, oropharynx is clear and moist and mucous membranes are normal. No trismus in the jaw. Abnormal dentition. Dental caries present. No dental abscesses or uvula swelling. No tonsillar abscesses.  Erythema upper and lower gums R side. Pt most tender at base of R lower 2nd molar  which is broken. Broken molar noted L lower. No gross abscess.   Eyes: Pupils are equal, round, and reactive to light.  Neck: Normal range of motion. Neck supple.  No neck swelling or Lugwig's angina  Neurological: He is alert.  Skin: Skin is warm and dry.  Psychiatric: He has a normal mood and affect.    ED Course  Procedures (including critical care time) Labs Review Labs Reviewed - No data to display  Imaging Review No results found.   EKG  Interpretation None      9:30 PM Patient seen and examined.  Vital signs reviewed and are as follows: Filed Vitals:   07/31/14 2005  BP: 127/76  Pulse: 52  Temp: 98.2 F (36.8 C)  Resp: 18   Urged dental f/u.   Medication ordered. Patient counseled on use of narcotic pain medications. Counseled not to combine these medications with others containing tylenol. Urged not to drink alcohol, drive, or perform any other activities that requires focus while taking these medications. The patient verbalizes understanding and agrees with the plan.    MDM   Final diagnoses:  Pain, dental   Patient with toothache. No fever. Exam unconcerning for Ludwig's angina or other deep tissue infection in neck.   As there is gum erythema, will treat with antibiotic and pain medicine. Urged patient to follow-up with dentist.         Renne CriglerJoshua Perez Dirico, PA-C 08/01/14 1401

## 2014-07-31 NOTE — ED Notes (Signed)
C/o pain to right upper and lower gums-also chipped 2 teeth 3 days ago

## 2014-08-01 NOTE — ED Provider Notes (Signed)
Medical screening examination/treatment/procedure(s) were performed by non-physician practitioner and as supervising physician I was immediately available for consultation/collaboration.   EKG Interpretation None        Gurkirat Basher, MD 08/01/14 1955 

## 2014-08-20 ENCOUNTER — Ambulatory Visit (INDEPENDENT_AMBULATORY_CARE_PROVIDER_SITE_OTHER): Payer: BC Managed Care – PPO | Admitting: Family Medicine

## 2014-08-20 VITALS — BP 100/66 | HR 70 | Temp 98.2°F | Resp 20 | Ht 69.0 in | Wt 149.5 lb

## 2014-08-20 DIAGNOSIS — Z202 Contact with and (suspected) exposure to infections with a predominantly sexual mode of transmission: Secondary | ICD-10-CM

## 2014-08-20 DIAGNOSIS — F902 Attention-deficit hyperactivity disorder, combined type: Secondary | ICD-10-CM

## 2014-08-20 DIAGNOSIS — K029 Dental caries, unspecified: Secondary | ICD-10-CM

## 2014-08-20 DIAGNOSIS — F909 Attention-deficit hyperactivity disorder, unspecified type: Secondary | ICD-10-CM | POA: Insufficient documentation

## 2014-08-20 MED ORDER — HYDROCODONE-ACETAMINOPHEN 5-325 MG PO TABS: ORAL_TABLET | ORAL | Status: DC

## 2014-08-20 MED ORDER — PENICILLIN V POTASSIUM 500 MG PO TABS: 500.0000 mg | ORAL_TABLET | Freq: Three times a day (TID) | ORAL | Status: DC

## 2014-08-20 MED ORDER — METHYLPHENIDATE HCL 10 MG PO TABS: 10.0000 mg | ORAL_TABLET | Freq: Two times a day (BID) | ORAL | Status: DC

## 2014-08-20 NOTE — Patient Instructions (Signed)
Sexually Transmitted Disease A sexually transmitted disease (STD) is a disease or infection that may be passed (transmitted) from person to person, usually during sexual activity. This may happen by way of saliva, semen, blood, vaginal mucus, or urine. Common STDs include:   Gonorrhea.   Chlamydia.   Syphilis.   HIV and AIDS.   Genital herpes.   Hepatitis B and C.   Trichomonas.   Human papillomavirus (HPV).   Pubic lice.   Scabies.  Mites.  Bacterial vaginosis. WHAT ARE CAUSES OF STDs? An STD may be caused by bacteria, a virus, or parasites. STDs are often transmitted during sexual activity if one person is infected. However, they may also be transmitted through nonsexual means. STDs may be transmitted after:   Sexual intercourse with an infected person.   Sharing sex toys with an infected person.   Sharing needles with an infected person or using unclean piercing or tattoo needles.  Having intimate contact with the genitals, mouth, or rectal areas of an infected person.   Exposure to infected fluids during birth. WHAT ARE THE SIGNS AND SYMPTOMS OF STDs? Different STDs have different symptoms. Some people may not have any symptoms. If symptoms are present, they may include:   Painful or bloody urination.   Pain in the pelvis, abdomen, vagina, anus, throat, or eyes.   A skin rash, itching, or irritation.  Growths, ulcerations, blisters, or sores in the genital and anal areas.  Abnormal vaginal discharge with or without bad odor.   Penile discharge in men.   Fever.   Pain or bleeding during sexual intercourse.   Swollen glands in the groin area.   Yellow skin and eyes (jaundice). This is seen with hepatitis.   Swollen testicles.  Infertility.  Sores and blisters in the mouth. HOW ARE STDs DIAGNOSED? To make a diagnosis, your health care provider may:   Take a medical history.   Perform a physical exam.   Take a sample of  any discharge to examine.  Swab the throat, cervix, opening to the penis, rectum, or vagina for testing.  Test a sample of your first morning urine.   Perform blood tests.   Perform a Pap test, if this applies.   Perform a colposcopy.   Perform a laparoscopy.  HOW ARE STDs TREATED? Treatment depends on the STD. Some STDs may be treated but not cured.   Chlamydia, gonorrhea, trichomonas, and syphilis can be cured with antibiotic medicine.   Genital herpes, hepatitis, and HIV can be treated, but not cured, with prescribed medicines. The medicines lessen symptoms.   Genital warts from HPV can be treated with medicine or by freezing, burning (electrocautery), or surgery. Warts may come back.   HPV cannot be cured with medicine or surgery. However, abnormal areas may be removed from the cervix, vagina, or vulva.   If your diagnosis is confirmed, your recent sexual partners need treatment. This is true even if they are symptom-free or have a negative culture or evaluation. They should not have sex until their health care providers say it is okay. HOW CAN I REDUCE MY RISK OF GETTING AN STD? Take these steps to reduce your risk of getting an STD:  Use latex condoms, dental dams, and water-soluble lubricants during sexual activity. Do not use petroleum jelly or oils.  Avoid having multiple sex partners.  Do not have sex with someone who has other sex partners.  Do not have sex with anyone you do not know or who is at   high risk for an STD.  Avoid risky sex practices that can break your skin.  Do not have sex if you have open sores on your mouth or skin.  Avoid drinking too much alcohol or taking illegal drugs. Alcohol and drugs can affect your judgment and put you in a vulnerable position.  Avoid engaging in oral and anal sex acts.  Get vaccinated for HPV and hepatitis. If you have not received these vaccines in the past, talk to your health care provider about whether one  or both might be right for you.   If you are at risk of being infected with HIV, it is recommended that you take a prescription medicine daily to prevent HIV infection. This is called pre-exposure prophylaxis (PrEP). You are considered at risk if:  You are a man who has sex with other men (MSM).  You are a heterosexual man or woman and are sexually active with more than one partner.  You take drugs by injection.  You are sexually active with a partner who has HIV.  Talk with your health care provider about whether you are at high risk of being infected with HIV. If you choose to begin PrEP, you should first be tested for HIV. You should then be tested every 3 months for as long as you are taking PrEP.  WHAT SHOULD I DO IF I THINK I HAVE AN STD?  See your health care provider.   Tell your sexual partner(s). They should be tested and treated for any STDs.  Do not have sex until your health care provider says it is okay. WHEN SHOULD I GET IMMEDIATE MEDICAL CARE? Contact your health care provider right away if:   You have severe abdominal pain.  You are a man and notice swelling or pain in your testicles.  You are a woman and notice swelling or pain in your vagina. Document Released: 03/05/2003 Document Revised: 12/18/2013 Document Reviewed: 07/03/2013 [facility] Patient Information 2015 [place], [place]. This information is not intended to replace advice given to you by your health care provider. Make sure you discuss any questions you have with your health care provider. Gonorrhea Gonorrhea is an infection that can cause serious problems. If left untreated, the infection may:   Damage the male or male organs.   Cause women to be unable to have children (sterility).   Harm a fetus if the infected woman is pregnant.  It is important to get treatment for gonorrhea as soon as possible. It is also necessary that all your sexual partners be tested for the infection.  CAUSES   Gonorrhea is caused by bacteria called Neisseria gonorrhoeae. The infection is spread from person to person, usually by sexual contact (such as by anal, vaginal, or oral means). A newborn can contract the infection from his or her mother during birth.  SYMPTOMS  Some people with gonorrhea do not have symptoms. Symptoms may be different in females and males.  Females The most common symptoms are:   Pain in the lower abdomen.   Fever with or without chills.  Other symptoms include:   Abnormal vaginal discharge.   Painful intercourse.   Burning or itching of the vagina or lips of the vagina.   Abnormal vaginal bleeding.   Pain when urinating.   Long-lasting (chronic) pain in the lower abdomen, especially during menstruation or intercourse.   Inability to become pregnant.   Going into premature labor.   Irritation, pain, bleeding, or discharge from the rectum. This may  occur if the infection was spread by anal sex.   Sore throat or swollen lymph nodes in the neck. This may occur if the infection was spread by oral sex.  Males The most common symptoms are:   Discharge from the penis.   Pain or burning during urination.   Pain or swelling in the testicles. Other symptoms may include:   Irritation, pain, bleeding, or discharge from the rectum. This may occur if the infection was spread by anal sex.   Sore throat, fever, or swollen lymph nodes in the neck. This may occur if the infection was spread by oral sex.  DIAGNOSIS  A diagnosis is made after a physical exam is done and a sample of discharge is examined under a microscope for the presence of the bacteria. The discharge may be taken from the urethra, cervix, throat, or rectum.  TREATMENT  Gonorrhea is treated with antibiotic medicines. It is important for treatment to begin as soon as possible. Early treatment may prevent some problems from developing.  HOME CARE INSTRUCTIONS   Take medicines only as  directed by your health care provider.   Take your antibiotic medicine as directed by your health care provider. Finish the antibiotic even if you start to feel better. Incomplete treatment will put you at risk for continued infection.   Do not have sex until treatment is complete or as directed by your health care provider.   Keep all follow-up visits as directed by your health care provider.   Not all test results are available during your visit. If your test results are not back during the visit, make an appointment with your health care provider to find out the results. Do not assume everything is normal if you have not heard from your health care provider or the medical facility. It is your responsibility to get your test results.  If you test positive for gonorrhea, inform your recent sexual partners. They need to be checked for gonorrhea even if they do not have symptoms. They may need treatment, even if they test negative for gonorrhea.  SEEK MEDICAL CARE IF:   You develop any bad reaction to the medicine you were prescribed. This may include:   A rash.   Nausea.   Vomiting.   Diarrhea.   Your symptoms do not improve after a few days of taking antibiotics.   Your symptoms get worse.   You develop increased pain, such as in the testicles (for males) or in the abdomen (for females).  You have a fever. MAKE SURE YOU:   Understand these instructions.  Will watch your condition.  Will get help right away if you are not doing well or get worse. Document Released: 12/10/2000 Document Revised: 04/29/2014 Document Reviewed: 06/20/2013 [facility] Patient Information 2015 [place], [place]. This information is not intended to replace advice given to you by your health care provider. Make sure you discuss any questions you have with your health care provider.

## 2014-08-20 NOTE — Progress Notes (Signed)
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 Comes in with persistent pain, tooth problem, need for refill on his ADHD medicine (patient is studying law), and request for STD testing.  Patient has no symptoms but he says his partner called him and he needs STD testing. Aref  Objective: No acute distress Patient has a large carious right tooth #29. He says he has an appointment tomorrow with the dentist.  Genitalia normal  Assessment:Dental caries - Plan: HYDROcodone-acetaminophen (NORCO/VICODIN) 5-325 MG per tablet, penicillin v potassium (VEETID) 500 MG tablet  Attention deficit hyperactivity disorder (ADHD), combined type - Plan: methylphenidate (RITALIN) 10 MG tablet  STD exposure - Plan: HIV antibody, RPR, Hepatitis B surface antigen, GC/chlamydia probe amp, urine Only given 8 Norco  Signed, Elvina Sidle, MD

## 2014-08-21 ENCOUNTER — Telehealth: Payer: Self-pay

## 2014-08-21 LAB — SYPHILIS: RPR W/REFLEX TO RPR TITER AND TREPONEMAL ANTIBODIES, TRADITIONAL SCREENING AND DIAGNOSIS ALGORITHM: RPR Ser Ql: REACTIVE — AB

## 2014-08-21 LAB — RPR TITER: RPR Titer: 1:2 {titer}

## 2014-08-21 LAB — HIV ANTIBODY (ROUTINE TESTING W REFLEX): HIV 1&2 Ab, 4th Generation: NONREACTIVE

## 2014-08-21 LAB — HEPATITIS B SURFACE ANTIGEN: Hepatitis B Surface Ag: NEGATIVE

## 2014-08-21 NOTE — Telephone Encounter (Signed)
Edward Dunn from [facility] called and left message regarding pt. She did not specify why she was calling. CB # [phone]

## 2014-08-22 LAB — GC/CHLAMYDIA PROBE AMP, URINE
Chlamydia, Swab/Urine, PCR: NEGATIVE
GC Probe Amp, Urine: NEGATIVE

## 2014-08-22 LAB — FLUORESCENT TREPONEMAL AB(FTA)-IGG-BLD: Fluorescent Treponemal ABS: REACTIVE — AB

## 2014-08-22 NOTE — Telephone Encounter (Signed)
Returned call and left message to call back. I left a message on her work cell at [phone], the second number she left when she called this morning.

## 2014-08-26 ENCOUNTER — Ambulatory Visit (INDEPENDENT_AMBULATORY_CARE_PROVIDER_SITE_OTHER): Payer: BC Managed Care – PPO | Admitting: Physician Assistant

## 2014-08-26 VITALS — BP 124/74 | HR 60 | Temp 98.3°F | Resp 16 | Ht 70.0 in | Wt 149.0 lb

## 2014-08-26 DIAGNOSIS — A530 Latent syphilis, unspecified as early or late: Secondary | ICD-10-CM

## 2014-08-26 MED ORDER — PENICILLIN G BENZATHINE 1200000 UNIT/2ML IM SUSP
2.4000 10*6.[IU] | INTRAMUSCULAR | Status: AC
  Administered 2014-08-26: 2.4 10*6.[IU] via INTRAMUSCULAR

## 2014-08-26 NOTE — Progress Notes (Signed)
   Subjective:    Patient ID: Edward Dunn, male    DOB: November 25, 1991, 23 y.o.   MRN: [id number]   PCP: No PCP Per Patient  Chief Complaint  Patient presents with  . Follow-up    treatment for std     HPI  This patient presents for Bicillin LA injections after recent STI screening was positive for syphilis.  He was seen here on 08/20/2014 with dental pain, requesting refill of ADHD medications and STI screening.  A partner contacted him to tell him he needed to get tested.  Previous STI screening was several years ago. He denies chancre or any other lesions on the genitalia or nearby skin.  He was contacted by phone that the RPR was positive.  Titer 1:2.  Review of Systems     Objective:   Physical Exam BP 124/74  Pulse 60  Temp(Src) 98.3 F (36.8 C) (Oral)  Resp 16  Ht  (1.778 m)  Wt 149 lb (67.586 kg)  BMI 21.38 kg/m2  SpO2 100% WDWNBM, A&O x 3. Skin is warm and dry.        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 2.4 mu weekly x 3 doses. Anticipatory guidance. - penicillin g benzathine (BICILLIN LA) 1200000 UNIT/2ML injection 2.4 Million Units; Inject 4 mLs (2.4 Million Units total) into the muscle once a week.   Fernande Bras, PA-C Physician Assistant-Certified [facility]

## 2014-08-30 ENCOUNTER — Telehealth: Payer: Self-pay

## 2014-08-30 NOTE — Telephone Encounter (Signed)
Pt is being treated with Bicillin- next dose scheduled 09/02/14.

## 2014-08-30 NOTE — Telephone Encounter (Signed)
[employer] Department would like a call back regarding this patient to verify he was treated for STD.  [phone]

## 2014-08-30 NOTE — Telephone Encounter (Signed)
Notified Health Department

## 2014-09-23 ENCOUNTER — Telehealth: Payer: Self-pay

## 2014-09-23 NOTE — Telephone Encounter (Signed)
Pt stopped by the office. Letter printed and given to him.

## 2014-09-23 NOTE — Telephone Encounter (Signed)
Patient only has Epic chart, no paper chart. There is a letter written by Dr. Dareen Piano from January 2015 regarding ADHD treatment. Is patient able to have a copy of this letter?

## 2014-09-23 NOTE — Telephone Encounter (Signed)
Pt states we wrote a letter a couple of years ago about his ADHD, wanted to know if we still have a copy of it. Please call pt at [phone]

## 2014-10-02 ENCOUNTER — Ambulatory Visit (INDEPENDENT_AMBULATORY_CARE_PROVIDER_SITE_OTHER): Payer: BC Managed Care – PPO | Admitting: Radiology

## 2014-10-02 DIAGNOSIS — A530 Latent syphilis, unspecified as early or late: Secondary | ICD-10-CM

## 2014-10-02 MED ORDER — PENICILLIN G BENZATHINE 1200000 UNIT/2ML IM SUSP
2.4000 10*6.[IU] | INTRAMUSCULAR | Status: AC
  Administered 2014-10-02: 2.4 10*6.[IU] via INTRAMUSCULAR

## 2014-11-19 ENCOUNTER — Ambulatory Visit (INDEPENDENT_AMBULATORY_CARE_PROVIDER_SITE_OTHER): Payer: BC Managed Care – PPO | Admitting: Physician Assistant

## 2014-11-19 VITALS — BP 114/70 | HR 67 | Temp 97.7°F | Resp 18 | Ht 71.0 in | Wt 149.0 lb

## 2014-11-19 DIAGNOSIS — R42 Dizziness and giddiness: Secondary | ICD-10-CM

## 2014-11-19 DIAGNOSIS — R51 Headache: Secondary | ICD-10-CM

## 2014-11-19 DIAGNOSIS — S0181XA Laceration without foreign body of other part of head, initial encounter: Secondary | ICD-10-CM

## 2014-11-19 MED ORDER — MELOXICAM 15 MG PO TABS: 15.0000 mg | ORAL_TABLET | Freq: Every day | ORAL | Status: DC

## 2014-11-19 NOTE — Progress Notes (Signed)
   Subjective:    Patient ID: Edward Dunn, male    DOB: May 17, 1991, 23 y.o.   MRN: [id number]  HPI  This is a 23 year old male presenting after an injury to his forehead that is 4.5 hours old. He was pulling a shirt out of his closet when the clothes bar detached from the wall and fell on his head. He did not have any loss of consciousness. He notes he felt dizzy afterwards but this subsided. He denies change in vision. He is having some throbbing pain at the site. He reports he got a tetanus vaccine 2-3 years ago.   Review of Systems  Constitutional: Negative for fever and chills.  Eyes: Negative for visual disturbance.  Gastrointestinal: Negative for nausea and vomiting.  Skin: Positive for wound.  Neurological: Positive for dizziness and headaches. Negative for syncope.   Patient Active Problem List   Diagnosis Date Noted  . ADHD (attention deficit hyperactivity disorder) 08/20/2014   Prior to Admission medications   Medication Sig Start Date End Date Taking? Authorizing Provider  methylphenidate (RITALIN) 10 MG tablet Take 1 tablet (10 mg total) by mouth 2 (two) times daily. 08/20/14 12/16/15 Yes Elvina SidleKurt Lauenstein, MD   No Known Allergies  Patient's social and family history were reviewed.      Objective:   Physical Exam  Constitutional: He is oriented to person, place, and time. He appears well-developed and well-nourished. No distress.  HENT:  Head: Normocephalic and atraumatic.    Right Ear: Hearing normal.  Left Ear: Hearing normal.  Nose: Nose normal.  4 cm superficial laceration on right side of forehead with a small skin flap. No foreign bodies.   Eyes: Conjunctivae, EOM and lids are normal. Pupils are equal, round, and reactive to light. Right eye exhibits no discharge. Left eye exhibits no discharge. No scleral icterus.  Pulmonary/Chest: Effort normal. No respiratory distress.  Musculoskeletal: Normal range of motion.  Neurological: He is alert and oriented to  person, place, and time. He has normal strength. No cranial nerve deficit or sensory deficit.  Skin: Skin is warm and dry. Lesion noted.  Psychiatric: He has a normal mood and affect. His speech is normal and behavior is normal. Thought content normal.   Procedure: Verbal consent obtained. Skin was anesthetized with 4 cc 1% lido with epi and cleaned with saline. A 4 cm laceration located the left side of his forehead was sutured with #11 6.0 ethilon simple sutures. Wound was dressed and wound care discussed.     Assessment & Plan:  1. Laceration of face, initial encounter #11 sutures placed, patient tolerated procedure well. He was given prescription for mobic for pain. Wound care was discussed. He will return in 5 days for suture removal.  - meloxicam (MOBIC) 15 MG tablet; Take 1 tablet (15 mg total) by mouth daily.  Dispense: 15 tablet; Refill: 1   Nicole V. Dyke BrackettBush, PA-C, MHS [facility]  11/19/2014

## 2014-11-19 NOTE — Progress Notes (Signed)
I actively participated during the procedure.  

## 2014-11-19 NOTE — Patient Instructions (Signed)
Return in 7 days for suture removal  Laceration Care, Adult A laceration is a cut or lesion that goes through all layers of the skin and into the tissue just beneath the skin. TREATMENT  Some lacerations may not require closure. Some lacerations may not be able to be closed due to an increased risk of infection. It is important to see your caregiver as soon as possible after an injury to minimize the risk of infection and maximize the opportunity for successful closure. If closure is appropriate, pain medicines may be given, if needed. The wound will be cleaned to help prevent infection. Your caregiver will use stitches (sutures), staples, wound glue (adhesive), or skin adhesive strips to repair the laceration. These tools bring the skin edges together to allow for faster healing and a better cosmetic outcome. However, all wounds will heal with a scar. Once the wound has healed, scarring can be minimized by covering the wound with sunscreen during the day for 1 full year. HOME CARE INSTRUCTIONS  For sutures or staples:  Keep the wound clean and dry.  If you were given a bandage (dressing), you should change it at least once a day. Also, change the dressing if it becomes wet or dirty, or as directed by your caregiver.  Wash the wound with soap and water 2 times a day. Rinse the wound off with water to remove all soap. Pat the wound dry with a clean towel.  After cleaning, apply a thin layer of the antibiotic ointment as recommended by your caregiver. This will help prevent infection and keep the dressing from sticking.  You may shower as usual after the first 24 hours. Do not soak the wound in water until the sutures are removed.  Only take over-the-counter or prescription medicines for pain, discomfort, or fever as directed by your caregiver.  Get your sutures or staples removed as directed by your caregiver. For skin adhesive strips:  Keep the wound clean and dry.  Do not get the skin  adhesive strips wet. You may bathe carefully, using caution to keep the wound dry.  If the wound gets wet, pat it dry with a clean towel.  Skin adhesive strips will fall off on their own. You may trim the strips as the wound heals. Do not remove skin adhesive strips that are still stuck to the wound. They will fall off in time. For wound adhesive:  You may briefly wet your wound in the shower or bath. Do not soak or scrub the wound. Do not swim. Avoid periods of heavy perspiration until the skin adhesive has fallen off on its own. After showering or bathing, gently pat the wound dry with a clean towel.  Do not apply liquid medicine, cream medicine, or ointment medicine to your wound while the skin adhesive is in place. This may loosen the film before your wound is healed.  If a dressing is placed over the wound, be careful not to apply tape directly over the skin adhesive. This may cause the adhesive to be pulled off before the wound is healed.  Avoid prolonged exposure to sunlight or tanning lamps while the skin adhesive is in place. Exposure to ultraviolet light in the first year will darken the scar.  The skin adhesive will usually remain in place for 5 to 10 days, then naturally fall off the skin. Do not pick at the adhesive film. You may need a tetanus shot if:  You cannot remember when you had your last tetanus   shot.  You have never had a tetanus shot. If you get a tetanus shot, your arm may swell, get red, and feel warm to the touch. This is common and not a problem. If you need a tetanus shot and you choose not to have one, there is a rare chance of getting tetanus. Sickness from tetanus can be serious. SEEK MEDICAL CARE IF:   You have redness, swelling, or increasing pain in the wound.  You see a red line that goes away from the wound.  You have yellowish-white fluid (pus) coming from the wound.  You have a fever.  You notice a bad smell coming from the wound or  dressing.  Your wound breaks open before or after sutures have been removed.  You notice something coming out of the wound such as wood or glass.  Your wound is on your hand or foot and you cannot move a finger or toe. SEEK IMMEDIATE MEDICAL CARE IF:   Your pain is not controlled with prescribed medicine.  You have severe swelling around the wound causing pain and numbness or a change in color in your arm, hand, leg, or foot.  Your wound splits open and starts bleeding.  You have worsening numbness, weakness, or loss of function of any joint around or beyond the wound.  You develop painful lumps near the wound or on the skin anywhere on your body. MAKE SURE YOU:   Understand these instructions.  Will watch your condition.  Will get help right away if you are not doing well or get worse. Document Released: 12/13/2005 Document Revised: 03/06/2012 Document Reviewed: 06/08/2011 Exit[employer] Patient Information 2015 [employer]. This information is not intended to replace advice given to you by your health care provider. Make sure you discuss any questions you have with your health care provider.  

## 2014-12-02 ENCOUNTER — Ambulatory Visit (INDEPENDENT_AMBULATORY_CARE_PROVIDER_SITE_OTHER): Payer: BC Managed Care – PPO | Admitting: Physician Assistant

## 2014-12-02 ENCOUNTER — Encounter: Payer: Self-pay | Admitting: Physician Assistant

## 2014-12-02 VITALS — BP 108/76 | HR 59 | Temp 98.0°F | Resp 18 | Wt 153.0 lb

## 2014-12-02 DIAGNOSIS — S0181XD Laceration without foreign body of other part of head, subsequent encounter: Secondary | ICD-10-CM

## 2014-12-02 NOTE — Progress Notes (Signed)
I have discussed this case with Ms. Bush, PA-C and agree.  

## 2014-12-02 NOTE — Progress Notes (Signed)
   Subjective:    Patient ID: Edward Dunn, male    DOB: 22-Sep-1991, 23 y.o.   MRN: [id number]  HPI  This is a 23 year old male here for suture removal. He sustained a 4 cm laceration to his forehead on 11/19/14. He was prescribed mobic for pain which was helpful to him. He was supposed to come back for suture removal in 7 days - he is 7 days late for removal. He denies fever, chills, pain, headache or dizziness.  Review of Systems  Constitutional: Negative for fever and chills.  Eyes: Negative for visual disturbance.  Skin: Positive for wound (healed).  Neurological: Negative for dizziness and headaches.   Patient Active Problem List   Diagnosis Date Noted  . ADHD (attention deficit hyperactivity disorder) 08/20/2014   Prior to Admission medications   Medication Sig Start Date End Date Taking? Authorizing Provider  meloxicam (MOBIC) 15 MG tablet Take 1 tablet (15 mg total) by mouth daily. 11/19/14  Yes Lanier ClamNicole Cruz Devilla V, PA-C  methylphenidate (RITALIN) 10 MG tablet Take 1 tablet (10 mg total) by mouth 2 (two) times daily. 08/20/14 12/16/15 Yes Elvina SidleKurt Lauenstein, MD   No Known Allergies   Patient's social and family history were reviewed.     Objective:   Physical Exam  Constitutional: He is oriented to person, place, and time. He appears well-developed and well-nourished. No distress.  HENT:  Head: Normocephalic and atraumatic.  Right Ear: Hearing normal.  Left Ear: Hearing normal.  Nose: Nose normal.  Eyes: Conjunctivae and lids are normal. Right eye exhibits no discharge. Left eye exhibits no discharge. No scleral icterus.  Pulmonary/Chest: Effort normal. No respiratory distress.  Musculoskeletal: Normal range of motion.  Neurological: He is alert and oriented to person, place, and time.  Skin: Skin is warm, dry and intact.  Wound is healed completely. No erythema, drainage or signs of infection. #11 sutures removed.  Psychiatric: He has a normal mood and affect. His speech is  normal and behavior is normal. Thought content normal.  BP 108/76 mmHg  Pulse 59  Temp(Src) 98 F (36.7 C) (Oral)  Resp 18  Wt 153 lb (69.4 kg)  SpO2 100%     Assessment & Plan:  1. Laceration of face, initial encounter Wound has healed completely. #11 sutures removed. Wound care discussed.   Roswell MinersNicole V. Dyke BrackettBush, PA-C, MHS [facility]  12/02/2014

## 2014-12-02 NOTE — Patient Instructions (Signed)
Return with any problems/concerns. No need for additional wound care.

## 2015-01-22 ENCOUNTER — Telehealth: Payer: Self-pay | Admitting: Physician Assistant

## 2015-01-22 ENCOUNTER — Ambulatory Visit (INDEPENDENT_AMBULATORY_CARE_PROVIDER_SITE_OTHER): Payer: BLUE CROSS/BLUE SHIELD | Admitting: Physician Assistant

## 2015-01-22 VITALS — BP 100/70 | HR 50 | Temp 98.5°F | Resp 18 | Ht 69.5 in | Wt 151.2 lb

## 2015-01-22 DIAGNOSIS — Z8619 Personal history of other infectious and parasitic diseases: Secondary | ICD-10-CM

## 2015-01-22 DIAGNOSIS — Z113 Encounter for screening for infections with a predominantly sexual mode of transmission: Secondary | ICD-10-CM

## 2015-01-22 DIAGNOSIS — F902 Attention-deficit hyperactivity disorder, combined type: Secondary | ICD-10-CM

## 2015-01-22 MED ORDER — METHYLPHENIDATE HCL 10 MG PO TABS: 10.0000 mg | ORAL_TABLET | Freq: Two times a day (BID) | ORAL | Status: DC

## 2015-01-22 NOTE — Telephone Encounter (Signed)
MUST be seen

## 2015-01-22 NOTE — Patient Instructions (Signed)
I will call you with the results of your test. Your syphilis titer is 1:2. Call if you need refills of your ritalin.

## 2015-01-22 NOTE — Telephone Encounter (Signed)
Does pt need an office visit or can we just print Rx?

## 2015-01-22 NOTE — Progress Notes (Signed)
Subjective:    Patient ID: Edward Dunn, male    DOB: [birth year], 24 y.o.   MRN: [id number]  HPI This is a 24 year old male with PMH ADHD and history of syphilis who is presenting for STD testing and refill of ritalin.  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 He was treated for latent syphilis but only completed 2 or 3 penicillin shots. He reports he had syphilis 8 years ago and was treated adequately. He only has male partners. He reports he has not been sexually active in 5 months. Syphilis is the only STD he has ever had. He is not having any current symptoms: no dysuria, urinary frequency, penile discharge, rectal pain, genital sores, abdominal pain, N/V/D.  Pt was put on ritalin in 2013 by Dr. Milus GlazierLauenstein d/t inability to focus in school. Pt has been in and out of college since that time. 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 He has not taken ritalin in 3 months d/t not have classes. He states he does not need it as much for work. He works at [employer]. He states the ritalin does not cause weight loss, palpitations, decreased appetite or insomnia.  Review of Systems  Constitutional: Negative for fever, chills and appetite change.  Cardiovascular: Negative for chest pain and palpitations.  Gastrointestinal: Negative for nausea, vomiting, abdominal pain, diarrhea and rectal pain.  Genitourinary: Negative for dysuria, frequency, discharge and genital sores.  Skin: Negative for rash.  Hematological: Negative for adenopathy.  Psychiatric/Behavioral: Negative for sleep disturbance.    Patient Active Problem List   Diagnosis Date Noted  . History of syphilis 01/22/2015  . ADHD (attention deficit hyperactivity disorder) 08/20/2014   Prior to Admission medications   Medication Sig Start Date End Date Taking? Authorizing Provider  methylphenidate (RITALIN) 10 MG tablet Take 1 tablet (10 mg total)  by mouth 2 (two) times daily. 01/22/15 05/19/16 Yes Lanier ClamNicole Delani Kohli V, PA-C   No Known Allergies  Patient's social and family history were reviewed.     Objective:   Physical Exam  Constitutional: He is oriented to person, place, and time. He appears well-developed and well-nourished. No distress.  HENT:  Head: Normocephalic and atraumatic.  Right Ear: Hearing normal.  Left Ear: Hearing normal.  Nose: Nose normal.  Eyes: Conjunctivae and lids are normal. Right eye exhibits no discharge. Left eye exhibits no discharge. No scleral icterus.  Cardiovascular: Normal rate, regular rhythm, normal heart sounds, intact distal pulses and normal pulses.   No murmur heard. Pulmonary/Chest: Effort normal and breath sounds normal. No respiratory distress. He has no wheezes. He has no rhonchi. He has no rales.  Musculoskeletal: Normal range of motion.  Neurological: He is alert and oriented to person, place, and time.  Skin: Skin is warm, dry and intact. No lesion and no rash noted.  Psychiatric: He has a normal mood and affect. His speech is normal and behavior is normal. Thought content normal.   BP 100/70 mmHg  Pulse 50  Temp(Src) 98.5 F (36.9 C) (Oral)  Resp 18  Ht 5' 9.5" (1.765 m)  Wt 151 lb 3.2 oz (68.584 kg)  BMI 22.02 kg/m2  SpO2 99%     Assessment & Plan:  1. Screen for STD (sexually transmitted disease) 2. History of syphilis Spoke with ID doctor on call who affirmed that a pt with history of syphilis with  a titer of 1:2 most likely indicates history of infection rather than new infection and he does not need the third penicillin injection. Discussed with pt if titer increases that would indicate new infection. Labs below pending.  - GC/Chlamydia Probe Amp - HIV antibody - RPR - Hepatitis B surface antigen - Hepatitis C antibody  2. Attention deficit hyperactivity disorder (ADHD), combined type Refilled 1 month of ritalin. He will call for additional refills. - methylphenidate  (RITALIN) 10 MG tablet; Take 1 tablet (10 mg total) by mouth 2 (two) times daily.  Dispense: 60 tablet; Refill: 0    Roswell Miners. Dyke Brackett, MHS [facility]  01/22/2015

## 2015-01-22 NOTE — Telephone Encounter (Signed)
Patient needs a refill on his Ritalin. Patient asked about our system for medication refills and leaving phone messages. After explaining to patient that phone messages could take 24-48 hours to be reviewed and receive a return call patient then stated that he wants this medicine refilled today so he is going to come in to the office today and see a provider and have them write the script. Patient then hung up on me.   [phone]

## 2015-01-22 NOTE — Progress Notes (Deleted)
   Subjective:    Patient ID: Edward Dunn, male    DOB: [birth year], 24 y.o.   MRN: [id number]  HPI  In   Review of Systems     Objective:   Physical Exam        Assessment & Plan:

## 2015-01-23 LAB — GC/CHLAMYDIA PROBE AMP
CT Probe RNA: NEGATIVE
GC PROBE AMP APTIMA: NEGATIVE

## 2015-01-23 LAB — HEPATITIS B SURFACE ANTIGEN: Hepatitis B Surface Ag: NEGATIVE

## 2015-01-23 LAB — RPR TITER: RPR Titer: 1:2 {titer}

## 2015-01-23 LAB — RPR: RPR Ser Ql: REACTIVE — AB

## 2015-01-23 LAB — HEPATITIS C ANTIBODY: HCV Ab: NEGATIVE

## 2015-01-23 LAB — HIV ANTIBODY (ROUTINE TESTING W REFLEX): HIV 1&2 Ab, 4th Generation: NONREACTIVE

## 2015-01-23 LAB — FLUORESCENT TREPONEMAL AB(FTA)-IGG-BLD: Fluorescent Treponemal ABS: REACTIVE — AB

## 2015-01-23 NOTE — Telephone Encounter (Signed)
Spoke with pt, he came in yesterday and received prescriptions.

## 2015-02-18 ENCOUNTER — Telehealth: Payer: Self-pay

## 2015-02-18 DIAGNOSIS — F902 Attention-deficit hyperactivity disorder, combined type: Secondary | ICD-10-CM

## 2015-02-18 NOTE — Telephone Encounter (Signed)
Patient would like a refill of methylphenidate (RITALIN) 10 MG tablet.  CB#: [phone]

## 2015-02-21 MED ORDER — METHYLPHENIDATE HCL 10 MG PO TABS: 10.0000 mg | ORAL_TABLET | Freq: Two times a day (BID) | ORAL | Status: DC

## 2015-02-21 NOTE — Telephone Encounter (Signed)
Rx refilled. Please call pt and let him know it is ready for pick up.

## 2015-02-21 NOTE — Telephone Encounter (Signed)
Left message Rx ready to pick up. 

## 2015-03-01 ENCOUNTER — Encounter (HOSPITAL_BASED_OUTPATIENT_CLINIC_OR_DEPARTMENT_OTHER): Payer: Self-pay

## 2015-03-01 ENCOUNTER — Emergency Department (HOSPITAL_BASED_OUTPATIENT_CLINIC_OR_DEPARTMENT_OTHER)
Admission: EM | Admit: 2015-03-01 | Discharge: 2015-03-01 | Disposition: A | Discharge status: Home / Self Care | Payer: BLUE CROSS/BLUE SHIELD | Attending: Emergency Medicine | Admitting: Emergency Medicine

## 2015-03-01 ENCOUNTER — Emergency Department (HOSPITAL_BASED_OUTPATIENT_CLINIC_OR_DEPARTMENT_OTHER): Payer: BLUE CROSS/BLUE SHIELD

## 2015-03-01 DIAGNOSIS — F909 Attention-deficit hyperactivity disorder, unspecified type: Secondary | ICD-10-CM | POA: Diagnosis not present

## 2015-03-01 DIAGNOSIS — S61411A Laceration without foreign body of right hand, initial encounter: Secondary | ICD-10-CM | POA: Insufficient documentation

## 2015-03-01 DIAGNOSIS — S61219A Laceration without foreign body of unspecified finger without damage to nail, initial encounter: Secondary | ICD-10-CM

## 2015-03-01 DIAGNOSIS — Z8679 Personal history of other diseases of the circulatory system: Secondary | ICD-10-CM | POA: Diagnosis not present

## 2015-03-01 DIAGNOSIS — S61001A Unspecified open wound of right thumb without damage to nail, initial encounter: Secondary | ICD-10-CM | POA: Insufficient documentation

## 2015-03-01 DIAGNOSIS — Z720 Tobacco use: Secondary | ICD-10-CM | POA: Insufficient documentation

## 2015-03-01 DIAGNOSIS — Y9389 Activity, other specified: Secondary | ICD-10-CM | POA: Insufficient documentation

## 2015-03-01 DIAGNOSIS — Y9289 Other specified places as the place of occurrence of the external cause: Secondary | ICD-10-CM | POA: Diagnosis not present

## 2015-03-01 DIAGNOSIS — Y998 Other external cause status: Secondary | ICD-10-CM | POA: Insufficient documentation

## 2015-03-01 DIAGNOSIS — W25XXXA Contact with sharp glass, initial encounter: Secondary | ICD-10-CM | POA: Diagnosis not present

## 2015-03-01 DIAGNOSIS — S6991XA Unspecified injury of right wrist, hand and finger(s), initial encounter: Secondary | ICD-10-CM | POA: Diagnosis present

## 2015-03-01 MED ORDER — LIDOCAINE HCL (PF) 1 % IJ SOLN
30.0000 mL | Freq: Once | INTRAMUSCULAR | Status: AC
  Administered 2015-03-01: 30 mL

## 2015-03-01 MED ORDER — HYDROCODONE-ACETAMINOPHEN 5-325 MG PO TABS: 1.0000 | ORAL_TABLET | Freq: Four times a day (QID) | ORAL | Status: DC | PRN

## 2015-03-01 MED ORDER — HYDROCODONE-ACETAMINOPHEN 5-325 MG PO TABS
2.0000 | ORAL_TABLET | Freq: Once | ORAL | Status: AC
  Administered 2015-03-01: 2 via ORAL
  Filled 2015-03-01: qty 2

## 2015-03-01 NOTE — ED Provider Notes (Signed)
CSN: [id number]     Arrival date & time 03/01/15  1709 History   First MD Initiated Contact with Patient 03/01/15 1845     Chief Complaint  Patient presents with  . Hand Injury     (Consider location/radiation/quality/duration/timing/severity/associated sxs/prior Treatment) HPI Phyllis GingerMarcus Ayotte is a 24 year old male with no significant past medical history of present illness the ER with a hand injury. Patient states he was lifting and opening a window when his hand slipped and went through the glass and would've the window. Patient notes multiple small lacerations to his hand. Patient denies numbness, tingling, weakness.  Past Medical History  Diagnosis Date  . Migraine   . ADHD (attention deficit hyperactivity disorder)    History reviewed. No pertinent past surgical history. No family history on file. History  Substance Use Topics  . Smoking status: Current Every Day Smoker -- 0.25 packs/day    Types: Cigarettes  . Smokeless tobacco: Never Used  . Alcohol Use: Yes     Comment: social    Review of Systems  Skin: Positive for wound.  Neurological: Negative for numbness.      Allergies  Review of patient's allergies indicates no known allergies.  Home Medications   Prior to Admission medications   Medication Sig Start Date End Date Taking? Authorizing Provider  HYDROcodone-acetaminophen (NORCO/VICODIN) 5-325 MG per tablet Take 1-2 tablets by mouth every 6 (six) hours as needed. 03/01/15   Monte FantasiaJoseph W Uniqua Kihn, PA-C  methylphenidate (RITALIN) 10 MG tablet Take 1 tablet (10 mg total) by mouth 2 (two) times daily. 02/21/15 06/18/16  Dorna LeitzNicole Bush V, PA-C   BP 112/68 mmHg  Pulse 78  Temp(Src) 98.7 F (37.1 C) (Oral)  Resp 16  Ht 5\' 11"  (1.803 m)  Wt 150 lb (68.04 kg)  BMI 20.93 kg/m2  SpO2 100% Physical Exam  Constitutional: He is oriented to person, place, and time. He appears well-developed and well-nourished. No distress.  HENT:  Head: Normocephalic and atraumatic.  Eyes:  Right eye exhibits no discharge. Left eye exhibits no discharge. No scleral icterus.  Neck: Normal range of motion.  Pulmonary/Chest: Effort normal. No respiratory distress.  Musculoskeletal: Normal range of motion.  Neurological: He is alert and oriented to person, place, and time.  Skin: Skin is warm and dry. He is not diaphoretic.  Patient has a 1 cm laceration to lower ulnar aspect of the palm, 3; laceration noted just inferior to ring finger on palm, avulsion injury on ulnar border of the thumb. Hemorrhaging controlled. Lacerations are all to the depth of the subcutaneous tissue only  Psychiatric: He has a normal mood and affect.  Nursing note and vitals reviewed.   ED Course  LACERATION REPAIR Date/Time: 03/03/2015 1:38 AM Performed by: Monte FantasiaMINTZ, JOSEPH W Authorized by: Monte FantasiaMINTZ, JOSEPH W Consent: Verbal consent obtained. Consent given by: patient Patient identity confirmed: verbally with patient Body area: upper extremity Location details: right hand Foreign bodies: glass and wood Tendon involvement: none Nerve involvement: none Vascular damage: no Local anesthetic: lidocaine 1% without epinephrine Anesthetic total: 10 ml Patient sedated: no Skin closure: 5-0 Prolene Number of sutures: 6 Technique: simple Approximation: close Approximation difficulty: simple Dressing: 4x4 sterile gauze Patient tolerance: Patient tolerated the procedure well with no immediate complications   (including critical care time) Labs Review Labs Reviewed - No data to display  Imaging Review Dg Hand Complete Right  03/01/2015   CLINICAL DATA:  Right hand injury with multiple lacerations with glass. Pain and bleeding. Initial encounter.  EXAM:  RIGHT HAND - COMPLETE 3+ VIEW  COMPARISON:  None.  FINDINGS: There is no evidence of acute fracture, subluxation or dislocation.  Tiny foreign bodies along the distal thumb and ring finger soft tissues noted.  No focal bony lesions are identified.  No other soft  tissue abnormalities are present.  IMPRESSION: Tiny foreign bodies along the distal thumb and ring finger soft tissues -correlate clinically.  No evidence of bony abnormality.   Electronically Signed   By: Harmon Pier M.D.   On: 03/01/2015 18:26     EKG Interpretation None      MDM   Final diagnoses:  Laceration of multiple sites of right hand and fingers, initial encounter    Hand went through glass window. Patient with multiple small lacerations. Tetanus updated within the last year. Wound cleaning complete with pressure irrigation, bottom of wound visualized, multiple small, superficial foreign bodies noted on radiographs, foreign bodies removed with urination and by manual removal. Laceration occurred < 8 hours prior to repair which was well tolerated. Pt has no co morbidities to effect normal wound healing. Discussed suture home care w pt and answered questions. Pt to f-u for wound check and suture removal in 7 days. Pt is hemodynamically stable w no complaints prior to dc. I discussed return precautions with patient, and patient verbalizes understanding and agreement with this plan. I encouraged patient to call or return to the ER should he have any questions or concerns.  BP 112/68 mmHg  Pulse 78  Temp(Src) 98.7 F (37.1 C) (Oral)  Resp 16  Ht  (1.803 m)  Wt 150 lb (68.04 kg)  BMI 20.93 kg/m2  SpO2 100%  Signed,  Ladona Mow, PA-C 1:55 AM  Patient seen and discussed with Dr. Rolan Bucco, M.D.    Monte Fantasia, PA-C 03/03/15 0155  Rolan Bucco, MD 03/03/15 [phone]

## 2015-03-01 NOTE — Discharge Instructions (Signed)
Sutured Wound Care °Sutures are stitches that can be used to close wounds. Wound care helps prevent pain and infection.  °HOME CARE INSTRUCTIONS  °· Rest and elevate the injured area until all the pain and swelling are gone. °· Only take over-the-counter or prescription medicines for pain, discomfort, or fever as directed by your caregiver. °· After 48 hours, gently wash the area with mild soap and water once a day, or as directed. Rinse off the soap. Pat the area dry with a clean towel. Do not rub the wound. This may cause bleeding. °· Follow your caregiver's instructions for how often to change the bandage (dressing). Stop using a dressing after 2 days or after the wound stops draining. °· If the dressing sticks, moisten it with soapy water and gently remove it. °· Apply ointment on the wound as directed. °· Avoid stretching a sutured wound. °· Drink enough fluids to keep your urine clear or pale yellow. °· Follow up with your caregiver for suture removal as directed. °· Use sunscreen on your wound for the next 3 to 6 months so the scar will not darken. °SEEK IMMEDIATE MEDICAL CARE IF:  °· Your wound becomes red, swollen, hot, or tender. °· You have increasing pain in the wound. °· You have a red streak that extends from the wound. °· There is pus coming from the wound. °· You have a fever. °· You have shaking chills. °· There is a bad smell coming from the wound. °· You have persistent bleeding from the wound. °MAKE SURE YOU:  °· Understand these instructions. °· Will watch your condition. °· Will get help right away if you are not doing well or get worse. °Document Released: 01/20/2005 Document Revised: 03/06/2012 Document Reviewed: 04/18/2011 °ExitCare® Patient Information ©2015 [employer]. This information is not intended to replace advice given to you by your health care provider. Make sure you discuss any questions you have with your health care provider. ° °Laceration Care, Adult °A laceration is a cut  or lesion that goes through all layers of the skin and into the tissue just beneath the skin. °TREATMENT  °Some lacerations may not require closure. Some lacerations may not be able to be closed due to an increased risk of infection. It is important to see your caregiver as soon as possible after an injury to minimize the risk of infection and maximize the opportunity for successful closure. °If closure is appropriate, pain medicines may be given, if needed. The wound will be cleaned to help prevent infection. Your caregiver will use stitches (sutures), staples, wound glue (adhesive), or skin adhesive strips to repair the laceration. These tools bring the skin edges together to allow for faster healing and a better cosmetic outcome. However, all wounds will heal with a scar. Once the wound has healed, scarring can be minimized by covering the wound with sunscreen during the day for 1 full year. °HOME CARE INSTRUCTIONS  °For sutures or staples: °· Keep the wound clean and dry. °· If you were given a bandage (dressing), you should change it at least once a day. Also, change the dressing if it becomes wet or dirty, or as directed by your caregiver. °· Wash the wound with soap and water 2 times a day. Rinse the wound off with water to remove all soap. Pat the wound dry with a clean towel. °· After cleaning, apply a thin layer of the antibiotic ointment as recommended by your caregiver. This will help prevent infection and keep   the dressing from sticking. °· You may shower as usual after the first 24 hours. Do not soak the wound in water until the sutures are removed. °· Only take over-the-counter or prescription medicines for pain, discomfort, or fever as directed by your caregiver. °· Get your sutures or staples removed as directed by your caregiver. °For skin adhesive strips: °· Keep the wound clean and dry. °· Do not get the skin adhesive strips wet. You may bathe carefully, using caution to keep the wound dry. °· If  the wound gets wet, pat it dry with a clean towel. °· Skin adhesive strips will fall off on their own. You may trim the strips as the wound heals. Do not remove skin adhesive strips that are still stuck to the wound. They will fall off in time. °For wound adhesive: °· You may briefly wet your wound in the shower or bath. Do not soak or scrub the wound. Do not swim. Avoid periods of heavy perspiration until the skin adhesive has fallen off on its own. After showering or bathing, gently pat the wound dry with a clean towel. °· Do not apply liquid medicine, cream medicine, or ointment medicine to your wound while the skin adhesive is in place. This may loosen the film before your wound is healed. °· If a dressing is placed over the wound, be careful not to apply tape directly over the skin adhesive. This may cause the adhesive to be pulled off before the wound is healed. °· Avoid prolonged exposure to sunlight or tanning lamps while the skin adhesive is in place. Exposure to ultraviolet light in the first year will darken the scar. °· The skin adhesive will usually remain in place for 5 to 10 days, then naturally fall off the skin. Do not pick at the adhesive film. °You may need a tetanus shot if: °· You cannot remember when you had your last tetanus shot. °· You have never had a tetanus shot. °If you get a tetanus shot, your arm may swell, get red, and feel warm to the touch. This is common and not a problem. If you need a tetanus shot and you choose not to have one, there is a rare chance of getting tetanus. Sickness from tetanus can be serious. °SEEK MEDICAL CARE IF:  °· You have redness, swelling, or increasing pain in the wound. °· You see a red line that goes away from the wound. °· You have yellowish-white fluid (pus) coming from the wound. °· You have a fever. °· You notice a bad smell coming from the wound or dressing. °· Your wound breaks open before or after sutures have been removed. °· You notice something  coming out of the wound such as wood or glass. °· Your wound is on your hand or foot and you cannot move a finger or toe. °SEEK IMMEDIATE MEDICAL CARE IF:  °· Your pain is not controlled with prescribed medicine. °· You have severe swelling around the wound causing pain and numbness or a change in color in your arm, hand, leg, or foot. °· Your wound splits open and starts bleeding. °· You have worsening numbness, weakness, or loss of function of any joint around or beyond the wound. °· You develop painful lumps near the wound or on the skin anywhere on your body. °MAKE SURE YOU:  °· Understand these instructions. °· Will watch your condition. °· Will get help right away if you are not doing well or get worse. °Document Released:   12/13/2005 Document Revised: 03/06/2012 Document Reviewed: 06/08/2011 °ExitCare® Patient Information ©2015 [employer]. This information is not intended to replace advice given to you by your health care provider. Make sure you discuss any questions you have with your health care provider. ° ° °Emergency Department Resource Guide °1) Find a Doctor and Pay Out of Pocket °Although you won't have to find out who is covered by your insurance plan, it is a good idea to ask around and get recommendations. You will then need to call the office and see if the doctor you have chosen will accept you as a new patient and what types of options they offer for patients who are self-pay. Some doctors offer discounts or will set up payment plans for their patients who do not have insurance, but you will need to ask so you aren't surprised when you get to your appointment. ° °2) Contact Your Local Health Department °Not all health departments have doctors that can see patients for sick visits, but many do, so it is worth a call to see if yours does. If you don't know where your local health department is, you can check in your phone book. The CDC also has a tool to help you locate your state's health  department, and many state websites also have listings of all of their local health departments. ° °3) Find a Walk-in Clinic °If your illness is not likely to be very severe or complicated, you may want to try a walk in clinic. These are popping up all over the country in pharmacies, drugstores, and shopping centers. They're usually staffed by nurse practitioners or physician assistants that have been trained to treat common illnesses and complaints. They're usually fairly quick and inexpensive. However, if you have serious medical issues or chronic medical problems, these are probably not your best option. ° °No Primary Care Doctor: °- Call [facility] at  832-8000 - they can help you locate a primary care doctor that  accepts your insurance, provides certain services, etc. °- Physician Referral Service- 1-800-533-3463 ° °Chronic Pain Problems: °Organization         Address  Phone   Notes  °[facility]  (336) 297-2271 Patients need to be referred by their primary care doctor.  ° °Medication Assistance: °Organization         Address  Phone   Notes  °[place] [facility] [address] °[place], [place] [address] (336) 641-8030 --Must be a resident of [place] °-- Must have NO insurance coverage whatsoever (no Medicaid/ Medicare, etc.) °-- The pt. MUST have a primary care doctor that directs their care regularly and follows them in the community °  °[facility]  (866) 331-1348   °[employer]  (888) 892-1162   ° °Agencies that provide inexpensive medical care: °Organization         Address  Phone   Notes  °[facility]  (336) 832-8035   °[facility]    (336) 832-7272   °[facility] [address] °[place],  [address] (336) 832-4777   °[facility] [address], °[place] (336) 271-4999   °[facility]    (336) 373-0678   °[facility]    (336) 272-1050    °[facility] ° [address],  Phone:  (336) 832-4444, Fax:  (336) 832-4440 Hours of Operation:  9 am - 6 pm, M-F.  Also   accepts Medicaid/Medicare and self-pay.  °[facility] ° [address], [place] Phone: (336) 832-3150, Fax: (336) 832-3151. Hours of Operation:  8:30 am - 5:30 pm, M-F.  Also accepts Medicaid and self-pay.  °[facility] [place] [address], [place] Phone: (336) 878-6027   °[facility] [address], [place], [place] (336)723-1848, Ext. 123 Mondays & Thursdays: 7-9 AM.  First 15 patients are seen on a first come, first serve basis. °  ° °Medicaid-accepting [place] Providers: ° °Organization         Address  Phone   Notes  °[facility] [address], [place] (336) 641-2100 Also accepts self-pay patients.  °[facility] [address], [place] ° (336) 856-9996   °[facility] [address], [place] (336) 288-8857   °[facility] [address] [place] [address], [place] (336) 299-7000   °Veita Bland [address], [place]  ° (336) 373-1557 Only accepts [place] Access Medicaid patients after they have their name applied to their card.  ° °Self-Pay (no insurance) in [place]: ° °Organization         Address  Phone   Notes  °Sickle Cell Patients, [facility] [address], [place] (336) 832-1970   °[facility] [address], [place] (336) 832-4400   ° [facility] ° [address], [place] (336) 992-4800   °[facility]/Dr. Osei-Bonsu ° [address] [place] [address], [place] or [address], [place] (336) 841-8500 Phone number for both [place] and [place] locations is the same.  °[facility] [address], [place] (336) 299-0000    °[facility] [address] [place] [address], [place] or [address] (336) 852-7530 °(336) 878-2260   °[facility] [address], [place] (336) 350-1642, phone; (336) 294-5005, fax Sees patients 1st and 3rd Saturday of every month.  Must not qualify for public or private insurance (i.e. Medicaid, Medicare, [place] Health Choice, Veterans' Benefits) • Household income should be no more than 200% of the poverty level •The clinic cannot treat you if you are pregnant or think you are pregnant • Sexually transmitted diseases are not treated at the clinic.  ° ° °Dental Care: °Organization         Address  Phone  Notes  °[facility] [address], [place] (336) 641-6152 Accepts children up to age 21 who are enrolled in Medicaid or [place] Health Choice; pregnant women with a Medicaid card; and children who have applied for Medicaid or [place] Health Choice, but were declined, whose parents can pay a reduced fee at time of service.  °[facility] [place]  [address], [place] (336) 641-7733 Accepts children up to age 21 who are enrolled in Medicaid or [place] Health Choice; pregnant women with a Medicaid card; and children who have applied for Medicaid or [place] Health Choice, but were declined, whose parents can pay a reduced fee at time of service.  °[facility] ° [address], [place] (336) 641-4533 Patients are seen by appointment only. Walk-ins are not accepted. [facility] will see patients 18 years of age and older. °Monday - Tuesday (8am-5pm) °Most   Wednesdays (8:30-5pm) °$30 per visit, cash only  °[facility] ° [address], [place] (336) 641-4533 Patients are seen by appointment only. Walk-ins are not accepted. [facility] will see patients 18 years of age and older. °One Wednesday Evening (Monthly: Volunteer Based).   $30 per visit, cash only  °[facility]  (919) 537-3737 for adults; Children under age 4, call Graduate Pediatric Dentistry at (919) [phone]. Children aged 4-14, please call (919) [phone] to request a pediatric application. ° Dental services are provided in all areas of dental care including fillings, crowns and bridges, complete and partial dentures, implants, gum treatment, root canals, and extractions. Preventive care is also provided. Treatment is provided to both adults and children. °Patients are selected via a lottery and there is often a waiting list. °  °[facility] [address], °[place] ° (336) 763-8833 [email or web address] °  °[facility] [address], [place], [place] (336)723-1848, Ext. 123 Second and Fourth Thursday of each month, opens at 6:30 AM; Clinic ends at 9 AM.  Patients are seen on a first-come first-served basis, and a limited number are seen during each clinic.  ° °[facility] ° [address], [place], [place] (336) 723-7904   Eligibility Requirements °You must have lived in [place], [place], or [place] counties for at least the last three months. °  You cannot be eligible for state or federal sponsored healthcare insurance, including Veterans Administration, Medicaid, or Medicare. °  You generally cannot be eligible for healthcare insurance through your employer.  °  How to apply: °Eligibility screenings are held every Tuesday and Wednesday afternoon from 1:00 pm until 4:00 pm. You do not need an appointment for the interview!  °[facility] [address], [place], [place] 336-631-2330   °[facility]  336-342-8273   °[facility]  336-703-3100   °[facility]  336-570-6415   ° °Behavioral Health Resources in the Community: °Intensive Outpatient Programs °Organization         Address  Phone  Notes  °[facility] [address], [place], [place] 336-878-6098   °[facility] [address], [place], [place] 336-832-9800   °[facility] [address], [place], [place] ° 336-882-2125   °[facility] [address],  °[place], [place] 1-800-853-5163 or 336-641-4981   °Substance Abuse Resources °Organization         Address  Phone  Notes  °[facility]  336-882-2125   °[facility]  336-784-9470   °[facility]  336-285-9073   °[facility]  336-845-3988   °Residential & Outpatient Substance Abuse Program  1-800-659-3381   °Psychological Services °Organization         Address  Phone  Notes  °[facility]  336- 832-9600   °[facility]  336- 378-7881   °[facility] [address], [place] 1-800-853-5163 or 336-641-4981   ° °Mobile Crisis Teams °Organization         Address  Phone  Notes  °[facility], Mobile Crisis Care Unit  1-877-626-1772   °Assertive °[facility] ° [address] [place], [place] 336-834-9664   °Sharon DeEsch [address] °[place] 336-554-5454   ° °Self-Help/Support Groups °Organization         Address  Phone               Notes  °[facility] [place] - variety of support groups  336- 373-1402 Call for more information  °Narcotics Anonymous (NA), [facility] [address], °[place]  2 meetings at this location  ° °Residential Treatment Programs °Organization         Address  Phone  Notes  °[facility] [address],    °[place]  1-866-801-8205   °[facility] ° [address], [place], [place] 704-293-8524   °[facility] [address], [place] 336-845-3988 Admissions: 8am-3pm M-F  °[facility] [address]    °[place], [place] 336-841-1104   °[facility] [address], [place], [place] 336-379-7146   °[facility] [address]  °[place], [place] 336-285-9073   °[facility] - Intensive Outpatient [address], [place], [place] 336-852-3033   °[facility] [address]  °[place], [place] 1-877-615-2722 or 336-784-9470   °[facility] [address] [place], [place] 336-227-7417 Accepts Medicaid  °[facility] [address]  °[place] 1-800-659-3381 Substance Abuse/Addiction Treatment  ° °[place] Behavioral Health Resources °Organization         Address  Phone  Notes  °[facility]  (888) 581-9988   °Julie Brannon, PhD [address] [place], [place]   (336) 349-5553 or (336) 951-0000   °[facility]   [address] °[place], [place] (336) 349-4454   °[facility] [address], [place], [place] (336) 342-8316 Insurance/Medicaid/sponsorship through [facility]  °[facility] [address]                                    [place], [place] (336) 342-8316 Therapy/tele-psych/case  °[facility] [address]  ° [place], [place] (336) 349-2233    °Dr. Arfeen  (336) 349-4544   °[facility] [place]  [employer] [place] [facility] 1) [address],  °2) [address], [place] °3)  [address], [place] (336) 349-3220 °(336) 342-7768 ° °(336) 342-8140   °[place] [facility] (336) 342-1394 or (336) 342-3537 (After Hours)    ° ° ° °

## 2015-03-01 NOTE — ED Notes (Addendum)
Pt reports was attempting to pull window up, states he "must've pulled too hard b/c my hand went right through".  Reports glass shattered, possible pieces of glass in wound.  Laceration to inner R hand and multiple small lacerations noted on thumb.  Pt reporting numbness to hand, full rom in all fingers.

## 2015-03-09 ENCOUNTER — Ambulatory Visit (INDEPENDENT_AMBULATORY_CARE_PROVIDER_SITE_OTHER): Payer: BLUE CROSS/BLUE SHIELD | Admitting: Physician Assistant

## 2015-03-09 VITALS — BP 120/80 | HR 79 | Temp 98.3°F | Resp 12 | Ht 69.5 in | Wt 146.0 lb

## 2015-03-09 DIAGNOSIS — Z4802 Encounter for removal of sutures: Secondary | ICD-10-CM

## 2015-03-09 NOTE — Progress Notes (Signed)
[facility] [address], [place] [id number] [phone]  Date:  03/09/2015   Name:  Edward Dunn   DOB:  1991-05-12   MRN:  [id number]  PCP:  No PCP Per Patient    Chief Complaint: Suture / Staple Removal and Medication Refill   History of Present Illness:  Edward GingerMarcus Foor is a 24 y.o. very pleasant male patient who presents with the following:  Patient is here today for suture removal after 8 days of suture placement on right hand and wrist after a window had fallen.  He states that the suture near his 4th MCP is still very painful.  He denies swelling, erythema, or drainage.  He had taken Hydrocodone/acetaminophen for pain, which helped very little.  He is requesting more for pain.    Patient Active Problem List   Diagnosis Date Noted  . History of syphilis 01/22/2015  . ADHD (attention deficit hyperactivity disorder) 08/20/2014    Past Medical History  Diagnosis Date  . Migraine   . ADHD (attention deficit hyperactivity disorder)     History reviewed. No pertinent past surgical history.  History  Substance Use Topics  . Smoking status: Current Every Day Smoker -- 0.25 packs/day    Types: Cigarettes  . Smokeless tobacco: Never Used  . Alcohol Use: Yes     Comment: social    History reviewed. No pertinent family history.  No Known Allergies  Medication list has been reviewed and updated.  Current Outpatient Prescriptions on File Prior to Visit  Medication Sig Dispense Refill  . methylphenidate (RITALIN) 10 MG tablet Take 1 tablet (10 mg total) by mouth 2 (two) times daily. 60 tablet 0  . HYDROcodone-acetaminophen (NORCO/VICODIN) 5-325 MG per tablet Take 1-2 tablets by mouth every 6 (six) hours as needed. (Patient not taking: Reported on 03/09/2015) 15 tablet 0   No current facility-administered medications on file prior to visit.    Review of Systems: ROS otherwise unremarkable unless listed above.    Physical Examination: Filed  Vitals:   03/09/15 0819  BP: 120/80  Pulse: 79  Temp: 91.9 F (33.3 C)  Resp: 12   Filed Vitals:   03/09/15 0819  Height: 5' 9.5" (1.765 m)  Weight: 146 lb (66.225 kg)   Body mass index is 21.26 kg/(m^2). Ideal Body Weight: Weight in (lb) to have BMI = 25: 171.4  Physical Exam  Constitutional: He is oriented to person, place, and time. He appears well-developed and well-nourished. No distress.  HENT:  Head: Normocephalic and atraumatic.  Eyes: EOM are normal. Pupils are equal, round, and reactive to light.  Cardiovascular: Normal rate.   Musculoskeletal:  Right hand with 1.5 cm laceration and 1 cm sutured laceration at the palm proximal to wrist.  Simple interrupted suture in place without exudate or erythema.  Pain with hyperextension and flexion 4th digit mcp.    Neurological: He is alert and oriented to person, place, and time.  Psychiatric: He has a normal mood and affect. His behavior is normal.      Assessment and Plan 24 year old male is here today to remove sutures placed 8 days ago.  Sutures removed without complication.   -Advised patient to keep the laceration covered for one week, when he is working and handling debris and chemicals.   -Advised to use ibuprofen for pain.  Visit for suture removal  Trena PlattStephanie Nickey Kloepfer, PA-C [facility] 3/13/20169:27 PM

## 2015-03-11 ENCOUNTER — Telehealth: Payer: Self-pay | Admitting: Physician Assistant

## 2015-03-11 NOTE — Telephone Encounter (Signed)
[facility] States that he wants to try that "prep thing".  [phone]

## 2015-03-11 NOTE — Telephone Encounter (Signed)
I can call pt but do you know what he is referring to?

## 2015-03-17 ENCOUNTER — Telehealth: Payer: Self-pay

## 2015-03-17 NOTE — Telephone Encounter (Signed)
nicole--can you help us with this?

## 2015-03-17 NOTE — Telephone Encounter (Signed)
Pt is requesting that we prescribe PREP. He said he spoke to nicole about this in the past but never heard from us as to whether we would prescribe it to him.

## 2015-03-18 NOTE — Telephone Encounter (Signed)
Spoke with pt about PREP 3 days ago - discussed this most likely is not needed for him. I did some research over the weekend. Call this AM and left VM to call back to discuss.

## 2015-03-26 NOTE — Telephone Encounter (Signed)
Called and left pt another VM. I do not think he needs PREP since he is not with a partner with HIV and does not have unprotected sex. If he still has questions about this he needs to RTC for an OV.

## 2015-03-27 ENCOUNTER — Ambulatory Visit (INDEPENDENT_AMBULATORY_CARE_PROVIDER_SITE_OTHER): Payer: BLUE CROSS/BLUE SHIELD | Admitting: Physician Assistant

## 2015-03-27 VITALS — BP 130/84 | HR 70 | Temp 98.1°F | Resp 16 | Ht 69.5 in | Wt 152.0 lb

## 2015-03-27 DIAGNOSIS — F902 Attention-deficit hyperactivity disorder, combined type: Secondary | ICD-10-CM

## 2015-03-27 DIAGNOSIS — K645 Perianal venous thrombosis: Secondary | ICD-10-CM | POA: Diagnosis not present

## 2015-03-27 DIAGNOSIS — Z113 Encounter for screening for infections with a predominantly sexual mode of transmission: Secondary | ICD-10-CM

## 2015-03-27 LAB — HIV ANTIBODY (ROUTINE TESTING W REFLEX): HIV: NONREACTIVE

## 2015-03-27 MED ORDER — METHYLPHENIDATE HCL 10 MG PO TABS: 10.0000 mg | ORAL_TABLET | Freq: Two times a day (BID) | ORAL | Status: DC

## 2015-03-27 MED ORDER — HYDROCODONE-ACETAMINOPHEN 5-325 MG PO TABS: 1.0000 | ORAL_TABLET | Freq: Four times a day (QID) | ORAL | Status: DC | PRN

## 2015-03-27 MED ORDER — DOCUSATE SODIUM 100 MG PO CAPS: 100.0000 mg | ORAL_CAPSULE | Freq: Two times a day (BID) | ORAL | Status: DC

## 2015-03-27 NOTE — Progress Notes (Signed)
Subjective:    Patient ID: Edward Dunn, male    DOB: 12-30-1990, 24 y.o.   MRN: [id number]  HPI  This is a 24 year old male with PMH ADD who is presenting with painful hemorrhoids. He is also needing medication refills and STD testing.  Hemorrhoids: reports he had hemorrhoids 4-5 years ago. At that time he was straining and not eating enough fiber. He stopped straining on the toilet and started eating healthier and has not had problems since. Yesterday he started having anal pain. He tried ibuprofen which did not help. States prior this happening he had a stomach bug and was having diarrhea several times a day. Pt is homosexual and has anal sex but has not but sexually active in several weeks. He denies anal discharge or bloody stool.  Pt is wanting HIV testing today. He recently broke up with his boyfriend of 7 years. He had full STD testing  2 months ago and negative but he is wanting proof of HIV negative before pursuing a new relationship. He is asymptomatic. He always uses condoms. He has a history of syphilis - last titer unchanged from prior.  Pt needs refill of ritalin. He is getting his bachelors degree. He is almost finished. He wants to go to law school afterwards.  Review of Systems  Constitutional: Negative for fever and chills.  Gastrointestinal: Positive for diarrhea and rectal pain. Negative for nausea, vomiting, abdominal pain, constipation, blood in stool and anal bleeding.  Genitourinary: Negative for dysuria and discharge.  Skin: Negative for rash.  Hematological: Negative for adenopathy.  Psychiatric/Behavioral: Negative for decreased concentration.    Patient Active Problem List   Diagnosis Date Noted  . History of syphilis 01/22/2015  . ADHD (attention deficit hyperactivity disorder) 08/20/2014   Prior to Admission medications   Medication Sig Start Date End Date Taking? Authorizing Provider  methylphenidate (RITALIN) 10 MG tablet Take 1 tablet (10 mg total) by  mouth 2 (two) times daily. 03/27/15 07/22/16 Yes Lanier ClamNicole Bush V, PA-C                 No Known Allergies  Patient's social and family history were reviewed.     Objective:   Physical Exam  Constitutional: He is oriented to person, place, and time. He appears well-developed and well-nourished. No distress.  HENT:  Head: Normocephalic and atraumatic.  Right Ear: Hearing normal.  Left Ear: Hearing normal.  Nose: Nose normal.  Eyes: Conjunctivae and lids are normal. Right eye exhibits no discharge. Left eye exhibits no discharge. No scleral icterus.  Cardiovascular: Normal rate and regular rhythm.   Pulmonary/Chest: Effort normal and breath sounds normal. No respiratory distress.  Genitourinary: Testes normal.  Superior anal sphincter. Inferior thrombosed hemorrhoid - erythematous, tender and firm  Musculoskeletal: Normal range of motion.  Neurological: He is alert and oriented to person, place, and time.  Skin: Skin is warm, dry and intact. No lesion and no rash noted.  Psychiatric: He has a normal mood and affect. His speech is normal and behavior is normal. Thought content normal.   Procedure: verbal consent obtained. Skin cleaned with alcohol. 2 cc 1% lido with epi injected for anesthesia. 2 betadine swabs used to clean the skin. An elliptical incision was made through thrombosed hemorrhoids. Clot removed. Wound cleaned and gauze placed between buttocks. Wound care discussed.    Assessment & Plan:  1. Screen for STD (sexually transmitted disease) - GC/Chlamydia Probe Amp - HIV antibody  2. Attention deficit hyperactivity  disorder (ADHD), combined type - methylphenidate (RITALIN) 10 MG tablet; Take 1 tablet (10 mg total) by mouth 2 (two) times daily.  Dispense: 60 tablet; Refill: 0  3. Perianal venous thrombosis thombosed hemorrhoid incised and clot removed. Colace BID x 2-3 days and norco q6hr for next 2-3 for pain. Counseled on sitz baths and fiber. He will return with further  problems. - HYDROcodone-acetaminophen (NORCO) 5-325 MG per tablet; Take 1 tablet by mouth every 6 (six) hours as needed.  Dispense: 30 tablet; Refill: 0 - docusate sodium (COLACE) 100 MG capsule; Take 1 capsule (100 mg total) by mouth 2 (two) times daily.  Dispense: 10 capsule; Refill: 0   [place] Miners. Dyke Brackett, MHS [facility]  03/27/2015

## 2015-03-27 NOTE — Patient Instructions (Signed)
Take colace twice a day for next 2-3 days. Take norco for pain for next 2-3 days. Sit in warm baths, eat lots of fiber and don't strain on the toilet. Return with further problems. I will call you with the results of your lab tests.

## 2015-03-28 ENCOUNTER — Encounter: Payer: Self-pay | Admitting: Physician Assistant

## 2015-03-28 LAB — GC/CHLAMYDIA PROBE AMP
CT PROBE, AMP APTIMA: NEGATIVE
GC Probe RNA: NEGATIVE

## 2015-03-29 ENCOUNTER — Telehealth: Payer: Self-pay

## 2015-03-29 ENCOUNTER — Other Ambulatory Visit: Payer: Self-pay | Admitting: Radiology

## 2015-03-29 NOTE — Telephone Encounter (Signed)
Patient called to request a revised work note.  He saw Lanier ClamNicole Bush on 03/27/15.  She wrote a work note excusing him on 3/30 and 3/31.  He would like a new note to excuse him until Monday, 4/4.  Please advise.  Thank you.  CB#: [phone]

## 2015-03-30 NOTE — Progress Notes (Signed)
  Medical screening examination/treatment/procedure(s) were performed by non-physician practitioner and as supervising physician I was immediately available for consultation/collaboration.     

## 2015-04-02 ENCOUNTER — Telehealth: Payer: Self-pay

## 2015-04-02 NOTE — Telephone Encounter (Signed)
Patient was recently seen and prescribed hydrocodone. Patient states that he was supposed to be prescribed something stronger but it turned out to be the same thing. Please call patient. He states that he is in throbbing pain and has to deal with it while working. Patient phone [phone]. He would like a voice message if he misses the phone call.

## 2015-04-02 NOTE — Telephone Encounter (Signed)
Gave pt below message. He says that Joni Reiningicole told him that he would have pain for 1 week and a half. The Hydrocodone isn't strong enough. He wants something stronger. Please advise.

## 2015-04-02 NOTE — Telephone Encounter (Signed)
Pt would like something stronger. Can we give him something?

## 2015-04-02 NOTE — Telephone Encounter (Signed)
lmom to cb. 

## 2015-04-02 NOTE — Telephone Encounter (Signed)
I see that he was here on 3/31 and prescribed hydrocodone-APAP 5/325.  He called on 4/02 regarding a work note.  I do not see any other request for stronger pain medication, etc.  Please get additional details.  If he is having such severe pain at this point, I recommend re-evaluation of the problem.

## 2015-04-03 NOTE — Telephone Encounter (Signed)
lmom of below message. 

## 2015-04-03 NOTE — Telephone Encounter (Signed)
I gave him norco for his initial pain after the procedure. That pain should be subsiding by now. He may use ibuprofen 800 mg alternating with tylenol to help with pain but if pain is not getting any better he should RTC for further evaluation.

## 2015-04-07 ENCOUNTER — Ambulatory Visit (INDEPENDENT_AMBULATORY_CARE_PROVIDER_SITE_OTHER): Payer: BLUE CROSS/BLUE SHIELD | Admitting: Urgent Care

## 2015-04-07 ENCOUNTER — Ambulatory Visit (INDEPENDENT_AMBULATORY_CARE_PROVIDER_SITE_OTHER): Payer: BLUE CROSS/BLUE SHIELD

## 2015-04-07 ENCOUNTER — Encounter: Payer: Self-pay | Admitting: Urgent Care

## 2015-04-07 VITALS — BP 102/64 | HR 64 | Temp 98.8°F | Resp 16 | Ht 69.75 in | Wt 143.4 lb

## 2015-04-07 DIAGNOSIS — M6248 Contracture of muscle, other site: Secondary | ICD-10-CM

## 2015-04-07 DIAGNOSIS — M542 Cervicalgia: Secondary | ICD-10-CM | POA: Diagnosis not present

## 2015-04-07 DIAGNOSIS — M62838 Other muscle spasm: Secondary | ICD-10-CM

## 2015-04-07 MED ORDER — CYCLOBENZAPRINE HCL 10 MG PO TABS: 10.0000 mg | ORAL_TABLET | Freq: Three times a day (TID) | ORAL | Status: DC | PRN

## 2015-04-07 MED ORDER — MELOXICAM 15 MG PO TABS: 15.0000 mg | ORAL_TABLET | Freq: Every day | ORAL | Status: DC
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
## 2015-04-07 NOTE — Patient Instructions (Signed)

## 2015-04-08 ENCOUNTER — Telehealth: Payer: Self-pay

## 2015-04-08 NOTE — Telephone Encounter (Signed)
Pt doesn't feel like he is ready to go back to work tomorrow.  Wants to know if we can extend his work note until the 14th or 15th.  Please call  [phone]

## 2015-04-09 ENCOUNTER — Ambulatory Visit: Payer: BLUE CROSS/BLUE SHIELD | Admitting: Physician Assistant

## 2015-04-09 NOTE — Telephone Encounter (Signed)
Previous message documented at 7:56pm on 04/08/2015. Patient called this morning at 8:07am wanting to know if his note is ready. Informed patient that our clinical staff and the provider he saw needs time to review message and OV notes then they will be able to adjust the note and someone will call him when it is ready for pick up.  [phone]

## 2015-04-09 NOTE — Telephone Encounter (Signed)
Gave pt message. He will rtc, says he still doesn't feel right.

## 2015-04-09 NOTE — Telephone Encounter (Signed)
Clinically, patient is cleared to return to work. His symptoms were subjective, he was provided with appropriate medication. There are no objective findings to write off of work for additional days. If he has new symptoms, he should return for re-evaluation.  Wallis BambergMario TRUE Shackleford, PA-C [facility] [phone] 04/09/2015  8:29 AM

## 2015-05-14 ENCOUNTER — Telehealth: Payer: Self-pay

## 2015-05-14 DIAGNOSIS — F902 Attention-deficit hyperactivity disorder, combined type: Secondary | ICD-10-CM

## 2015-05-14 MED ORDER — METHYLPHENIDATE HCL 10 MG PO TABS: 10.0000 mg | ORAL_TABLET | Freq: Two times a day (BID) | ORAL | Status: DC

## 2015-05-14 NOTE — Telephone Encounter (Signed)
Rx refilled and ready for pickup.

## 2015-05-14 NOTE — Telephone Encounter (Signed)
Patient is calling to request a refill for Ritalin. Please call when ready! Patient phone: [phone]

## 2015-05-14 NOTE — Telephone Encounter (Signed)
lmom that rx is ready. 

## 2015-05-22 ENCOUNTER — Encounter (HOSPITAL_BASED_OUTPATIENT_CLINIC_OR_DEPARTMENT_OTHER): Payer: Self-pay | Admitting: Emergency Medicine

## 2015-05-22 ENCOUNTER — Emergency Department (HOSPITAL_BASED_OUTPATIENT_CLINIC_OR_DEPARTMENT_OTHER)
Admission: EM | Admit: 2015-05-22 | Discharge: 2015-05-22 | Disposition: A | Discharge status: Home / Self Care | Payer: BLUE CROSS/BLUE SHIELD | Attending: Emergency Medicine | Admitting: Emergency Medicine

## 2015-05-22 DIAGNOSIS — Z791 Long term (current) use of non-steroidal anti-inflammatories (NSAID): Secondary | ICD-10-CM | POA: Insufficient documentation

## 2015-05-22 DIAGNOSIS — Y9289 Other specified places as the place of occurrence of the external cause: Secondary | ICD-10-CM | POA: Insufficient documentation

## 2015-05-22 DIAGNOSIS — G43909 Migraine, unspecified, not intractable, without status migrainosus: Secondary | ICD-10-CM | POA: Insufficient documentation

## 2015-05-22 DIAGNOSIS — K0889 Other specified disorders of teeth and supporting structures: Secondary | ICD-10-CM

## 2015-05-22 DIAGNOSIS — S025XXA Fracture of tooth (traumatic), initial encounter for closed fracture: Secondary | ICD-10-CM | POA: Diagnosis not present

## 2015-05-22 DIAGNOSIS — X58XXXA Exposure to other specified factors, initial encounter: Secondary | ICD-10-CM | POA: Diagnosis not present

## 2015-05-22 DIAGNOSIS — Z720 Tobacco use: Secondary | ICD-10-CM | POA: Diagnosis not present

## 2015-05-22 DIAGNOSIS — Y9389 Activity, other specified: Secondary | ICD-10-CM | POA: Insufficient documentation

## 2015-05-22 DIAGNOSIS — F909 Attention-deficit hyperactivity disorder, unspecified type: Secondary | ICD-10-CM | POA: Insufficient documentation

## 2015-05-22 DIAGNOSIS — Y998 Other external cause status: Secondary | ICD-10-CM | POA: Diagnosis not present

## 2015-05-22 DIAGNOSIS — K088 Other specified disorders of teeth and supporting structures: Secondary | ICD-10-CM | POA: Diagnosis present

## 2015-05-22 MED ORDER — BUPIVACAINE-EPINEPHRINE (PF) 0.5% -1:200000 IJ SOLN
INTRAMUSCULAR | Status: AC
  Administered 2015-05-22: 1 mL
  Filled 2015-05-22: qty 1.8

## 2015-05-22 MED ORDER — PENICILLIN V POTASSIUM 250 MG PO TABS: 250.0000 mg | ORAL_TABLET | Freq: Four times a day (QID) | ORAL | Status: AC

## 2015-05-22 NOTE — ED Notes (Signed)
Patient reports dental pain which began approximately 1 hour PTA.

## 2015-05-22 NOTE — ED Provider Notes (Signed)
CSN: [id number]     Arrival date & time 05/22/15  2256 History   First MD Initiated Contact with Patient 05/22/15 2306     Chief Complaint  Patient presents with  . Dental Pain     (Consider location/radiation/quality/duration/timing/severity/associated sxs/prior Treatment) Patient is a 24 y.o. male presenting with tooth pain.  Dental Pain Location:  Upper Upper teeth location:  14/LU 1st molar Quality:  No pain Severity:  Moderate Onset quality:  Gradual Duration:  1 hour Timing:  Constant Progression:  Worsening Context comment:  Large dental fracture after eating a sugar daddy, does not have tooth Relieved by:  Nothing Worsened by:  Hot food/drink, cold food/drink and touching Associated symptoms: no congestion, no drooling, no fever and no oral lesions     Past Medical History  Diagnosis Date  . Migraine   . ADHD (attention deficit hyperactivity disorder)    History reviewed. No pertinent past surgical history. History reviewed. No pertinent family history. History  Substance Use Topics  . Smoking status: Current Every Day Smoker -- 0.25 packs/day    Types: Cigarettes  . Smokeless tobacco: Never Used  . Alcohol Use: Yes     Comment: social    Review of Systems  Constitutional: Negative for fever.  HENT: Negative for congestion, drooling and mouth sores.   All other systems reviewed and are negative.     Allergies  Review of patient's allergies indicates no known allergies.  Home Medications   Prior to Admission medications   Medication Sig Start Date End Date Taking? Authorizing Provider  cyclobenzaprine (FLEXERIL) 10 MG tablet Take 1 tablet (10 mg total) by mouth 3 (three) times daily as needed for muscle spasms. 04/07/15   Wallis BambergMario Mani, PA-C  docusate sodium (COLACE) 100 MG capsule Take 1 capsule (100 mg total) by mouth 2 (two) times daily. 03/27/15   Dorna LeitzNicole Bush V, PA-C  HYDROcodone-acetaminophen (NORCO) 5-325 MG per tablet Take 1 tablet by mouth every 6  (six) hours as needed. Patient not taking: Reported on 04/07/2015 03/27/15   Dorna LeitzNicole Bush V, PA-C  meloxicam (MOBIC) 15 MG tablet Take 1 tablet (15 mg total) by mouth daily. 04/07/15   Wallis BambergMario Mani, PA-C  methylphenidate (RITALIN) 10 MG tablet Take 1 tablet (10 mg total) by mouth 2 (two) times daily. 05/14/15 09/08/16  Dorna LeitzNicole Bush V, PA-C   BP 129/73 mmHg  Pulse 72  Temp(Src) 98.1 F (36.7 C) (Oral)  Resp 18  Ht 5\' 10"  (1.778 m)  Wt 140 lb (63.504 kg)  BMI 20.09 kg/m2  SpO2 100% Physical Exam  Constitutional: He is oriented to person, place, and time. He appears well-developed and well-nourished.  HENT:  Head: Normocephalic and atraumatic.  Only exposed root left of LU1st molar with small enamel fragments  Eyes: Conjunctivae and EOM are normal.  Neck: Normal range of motion. Neck supple.  Cardiovascular: Normal rate, regular rhythm and normal heart sounds.   Pulmonary/Chest: Effort normal and breath sounds normal. No respiratory distress.  Abdominal: He exhibits no distension. There is no tenderness. There is no rebound and no guarding.  Musculoskeletal: Normal range of motion.  Neurological: He is alert and oriented to person, place, and time.  Skin: Skin is warm and dry.  Vitals reviewed.   ED Course  NERVE BLOCK Date/Time: 05/22/2015 11:29 PM Performed by: Mirian MoGENTRY, Arnesha Schiraldi Authorized by: Mirian MoGENTRY, Sachiko Methot Indications: pain relief Body area: face/mouth Nerve: middle superior alveolar Laterality: left Needle gauge: 25 G Location technique: anatomical landmarks Local anesthetic: bupivacaine 0.5% without  epinephrine Outcome: pain improved  Dental Date/Time: 05/22/2015 11:29 PM Performed by: Mirian Mo Authorized by: Mirian Mo Consent: Verbal consent obtained. Comments: Dental paste applied to exposed root of LU1st molar   (including critical care time) Labs Review Labs Reviewed - No data to display  Imaging Review No results found.   EKG Interpretation None       MDM   Final diagnoses:  None    24 y.o. male without pertinent PMH presents with dental pain after fracture of LU 1st molar 3 days ago, acutely worsening tonight.  No systemic symptoms.  Root exposed, hemostatic, with very minimal crown fragments along lateral aspects of socket left.  Dental block and paste performed and encouraged close dentistry fu (pt has dentist from prior extraction).  Abx prescription provided.  DC home in stable condition.    I have reviewed all laboratory and imaging studies if ordered as above  No diagnosis found.      Mirian Mo, MD 05/22/15 [phone]

## 2015-05-22 NOTE — ED Notes (Signed)
MD at bedside. 

## 2015-05-22 NOTE — Discharge Instructions (Signed)
Dental Fracture °You have a dental fracture or injury. This can mean the tooth is loose, has a chip in the enamel or is broken. If just the outer enamel is chipped, there is a good chance the tooth will not become infected. The only treatment needed may be to smooth off a rough edge. Fractures into the deeper layers (dentin and pulp) cause greater pain and are more likely to become infected. These require you to see a dentist as soon as possible to save the tooth. °Loose teeth may need to be wired or bonded with a plastic splint to hold them in place. A paste may be painted on the open area of the broken tooth to reduce the pain. Antibiotics and pain medicine may be prescribed. Choosing a soft or liquid diet and rinsing the mouth out with warm water after meals may be helpful. °See your dentist as recommended. Failure to seek care or follow up with a dentist or other specialist as recommended could result in the loss of your tooth, infection, or permanent dental problems. °SEEK MEDICAL CARE IF:  °· You have increased pain not controlled with medicines. °· You have swelling around the tooth, in the face or neck. °· You have bleeding which starts, continues, or gets worse. °· You have a fever. °Document Released: 01/20/2005 Document Revised: 03/06/2012 Document Reviewed: 11/04/2009 °[employer]® Patient Information ©2015 [employer]. This information is not intended to replace advice given to you by your health care provider. Make sure you discuss any questions you have with your health care provider. ° °

## 2015-06-02 ENCOUNTER — Emergency Department (HOSPITAL_BASED_OUTPATIENT_CLINIC_OR_DEPARTMENT_OTHER)
Admission: EM | Admit: 2015-06-02 | Discharge: 2015-06-02 | Disposition: A | Discharge status: Home / Self Care | Payer: BLUE CROSS/BLUE SHIELD | Attending: Emergency Medicine | Admitting: Emergency Medicine

## 2015-06-02 ENCOUNTER — Encounter (HOSPITAL_BASED_OUTPATIENT_CLINIC_OR_DEPARTMENT_OTHER): Payer: Self-pay | Admitting: Emergency Medicine

## 2015-06-02 DIAGNOSIS — G43909 Migraine, unspecified, not intractable, without status migrainosus: Secondary | ICD-10-CM | POA: Diagnosis not present

## 2015-06-02 DIAGNOSIS — S61011A Laceration without foreign body of right thumb without damage to nail, initial encounter: Secondary | ICD-10-CM | POA: Diagnosis not present

## 2015-06-02 DIAGNOSIS — S01122A Laceration with foreign body of left eyelid and periocular area, initial encounter: Secondary | ICD-10-CM | POA: Insufficient documentation

## 2015-06-02 DIAGNOSIS — Z23 Encounter for immunization: Secondary | ICD-10-CM | POA: Insufficient documentation

## 2015-06-02 DIAGNOSIS — Z720 Tobacco use: Secondary | ICD-10-CM | POA: Insufficient documentation

## 2015-06-02 DIAGNOSIS — Y9301 Activity, walking, marching and hiking: Secondary | ICD-10-CM | POA: Diagnosis not present

## 2015-06-02 DIAGNOSIS — Y998 Other external cause status: Secondary | ICD-10-CM | POA: Insufficient documentation

## 2015-06-02 DIAGNOSIS — Z79899 Other long term (current) drug therapy: Secondary | ICD-10-CM | POA: Diagnosis not present

## 2015-06-02 DIAGNOSIS — W010XXA Fall on same level from slipping, tripping and stumbling without subsequent striking against object, initial encounter: Secondary | ICD-10-CM | POA: Insufficient documentation

## 2015-06-02 DIAGNOSIS — Z791 Long term (current) use of non-steroidal anti-inflammatories (NSAID): Secondary | ICD-10-CM | POA: Insufficient documentation

## 2015-06-02 DIAGNOSIS — Z8659 Personal history of other mental and behavioral disorders: Secondary | ICD-10-CM | POA: Insufficient documentation

## 2015-06-02 DIAGNOSIS — S060X0A Concussion without loss of consciousness, initial encounter: Secondary | ICD-10-CM | POA: Diagnosis not present

## 2015-06-02 DIAGNOSIS — Y92009 Unspecified place in unspecified non-institutional (private) residence as the place of occurrence of the external cause: Secondary | ICD-10-CM | POA: Diagnosis not present

## 2015-06-02 DIAGNOSIS — W19XXXA Unspecified fall, initial encounter: Secondary | ICD-10-CM

## 2015-06-02 DIAGNOSIS — S01112A Laceration without foreign body of left eyelid and periocular area, initial encounter: Secondary | ICD-10-CM

## 2015-06-02 MED ORDER — HYDROCODONE-ACETAMINOPHEN 5-325 MG PO TABS
1.0000 | ORAL_TABLET | Freq: Once | ORAL | Status: AC
  Administered 2015-06-02: 1 via ORAL
  Filled 2015-06-02: qty 1

## 2015-06-02 MED ORDER — TETANUS-DIPHTH-ACELL PERTUSSIS 5-2.5-18.5 LF-MCG/0.5 IM SUSP
0.5000 mL | Freq: Once | INTRAMUSCULAR | Status: AC
  Administered 2015-06-02: 0.5 mL via INTRAMUSCULAR
  Filled 2015-06-02: qty 0.5

## 2015-06-02 MED ORDER — NAPROXEN 500 MG PO TABS: 500.0000 mg | ORAL_TABLET | Freq: Two times a day (BID) | ORAL | Status: DC | PRN

## 2015-06-02 MED ORDER — LIDOCAINE-EPINEPHRINE 2 %-1:200000 IJ SOLN
10.0000 mL | Freq: Once | INTRAMUSCULAR | Status: AC
Start: 2015-06-02 — End: 2015-06-02
  Administered 2015-06-02: 10 mL
  Filled 2015-06-02: qty 10

## 2015-06-02 MED ORDER — HYDROCODONE-ACETAMINOPHEN 5-325 MG PO TABS: 1.0000 | ORAL_TABLET | Freq: Four times a day (QID) | ORAL | Status: DC | PRN

## 2015-06-02 NOTE — ED Notes (Signed)
Pt states he walked off front porch and doesn't remember falling.  Pt has lac over left eye from fall.

## 2015-06-02 NOTE — ED Provider Notes (Signed)
CSN: [id number]     Arrival date & time 06/02/15  1738 History   First MD Initiated Contact with Patient 06/02/15 1749     Chief Complaint  Patient presents with  . Fall     (Consider location/radiation/quality/duration/timing/severity/associated sxs/prior Treatment) HPI Comments: Edward Dunn is a 24 y.o. male with a PMHx of ADHD and migraines, who presents to the ED with complaints of fall off of his front porch approximately 4 inches onto gravel, unwitnessed, striking his left eyebrow against the gravel, which occurred just PTA. He denies any loss of consciousness, but states that he didn't realize he was falling until he was on the ground. He reports that his left eyebrow has a small laceration and reports the pain is 10/10 throbbing constant nonradiating pain, worse with blinking, with no medications or treatments tried prior to arrival. He also reports a abrasion to his right thumb. He denies any vision changes, headache, loss of consciousness, altered mental status, confusion, lightheadedness, dizziness, chest pain, shortness breath, fevers, chills, abdominal pain, nausea, vomiting, numbness, tingling, or weakness. Unknown last tetanus shot.  Patient is a 24 y.o. male presenting with fall. The history is provided by the patient. No language interpreter was used.  Fall This is a new problem. The current episode started today. The problem occurs rarely. The problem has been unchanged. Pertinent negatives include no abdominal pain, arthralgias, chest pain, chills, fever, headaches, myalgias, nausea, neck pain, numbness, vertigo, visual change, vomiting or weakness. Nothing aggravates the symptoms. He has tried nothing for the symptoms. The treatment provided no relief.    Past Medical History  Diagnosis Date  . Migraine   . ADHD (attention deficit hyperactivity disorder)    History reviewed. No pertinent past surgical history. History reviewed. No pertinent family history. History    Substance Use Topics  . Smoking status: Current Every Day Smoker -- 0.25 packs/day    Types: Cigarettes  . Smokeless tobacco: Never Used  . Alcohol Use: Yes     Comment: social    Review of Systems  Constitutional: Negative for fever and chills.  HENT: Negative for facial swelling.   Eyes: Negative for photophobia, pain and visual disturbance.  Respiratory: Negative for shortness of breath.   Cardiovascular: Negative for chest pain.  Gastrointestinal: Negative for nausea, vomiting, abdominal pain, diarrhea and constipation.  Genitourinary: Negative for dysuria and hematuria.  Musculoskeletal: Negative for myalgias, arthralgias and neck pain.  Skin: Positive for wound (L upper eyebrow). Negative for color change.  Allergic/Immunologic: Negative for immunocompromised state.  Neurological: Negative for dizziness, vertigo, syncope, weakness, light-headedness, numbness and headaches.  Psychiatric/Behavioral: Negative for confusion.   10 Systems reviewed and are negative for acute change except as noted in the HPI.    Allergies  Review of patient's allergies indicates no known allergies.  Home Medications   Prior to Admission medications   Medication Sig Start Date End Date Taking? Authorizing Provider  cyclobenzaprine (FLEXERIL) 10 MG tablet Take 1 tablet (10 mg total) by mouth 3 (three) times daily as needed for muscle spasms. 04/07/15   [name], PA-C  docusate sodium (COLACE) 100 MG capsule Take 1 capsule (100 mg total) by mouth 2 (two) times daily. 03/27/15   Dorna Leitz, PA-C  HYDROcodone-acetaminophen (NORCO) 5-325 MG per tablet Take 1 tablet by mouth every 6 (six) hours as needed. Patient not taking: Reported on 04/07/2015 03/27/15   Dorna Leitz, PA-C  meloxicam (MOBIC) 15 MG tablet Take 1 tablet (15 mg total) by mouth  daily. 04/07/15   Wallis BambergMario Mani, PA-C  methylphenidate (RITALIN) 10 MG tablet Take 1 tablet (10 mg total) by mouth 2 (two) times daily. 05/14/15 09/08/16  Dorna LeitzNicole  Bush V, PA-C   BP 112/59 mmHg  Pulse 83  Temp(Src) 98.4 F (36.9 C) (Oral)  Resp 18  Ht 5\' 11"  (1.803 m)  Wt 155 lb (70.308 kg)  BMI 21.63 kg/m2  SpO2 95% Physical Exam  Constitutional: He is oriented to person, place, and time. Vital signs are normal. He appears well-developed and well-nourished.  Non-toxic appearance. No distress.  Afebrile, nontoxic, NAD  HENT:  Head: Normocephalic. Head is with laceration. Head is without raccoon's eyes, without Battle's sign and without contusion.    Mouth/Throat: Mucous membranes are normal.  L upper eyebrow lac approx 1cm in length, with no ongoing bleeding. No facial or scalp TTP, no bony crepitus. No raccoon eyes or battle's sign. No rhinorrhea or otorrhea.   Eyes: Conjunctivae and EOM are normal. Pupils are equal, round, and reactive to light. Right eye exhibits no discharge. Left eye exhibits no discharge.  PERRL, EOMI, no nystagmus, no visual field deficits  L upper eyelid laceration as noted above  Neck: Normal range of motion. Neck supple.  Cardiovascular: Normal rate and intact distal pulses.   Pulmonary/Chest: Effort normal. No respiratory distress.  Abdominal: Normal appearance. He exhibits no distension.  Musculoskeletal: Normal range of motion.  MAE x4 Strength and sensation grossly intact Distal pulses intact B/L hands with FROM intact in all digits, with small abrasion to R thumb DIP joint, bleeding controlled.   Neurological: He is alert and oriented to person, place, and time. He has normal strength. No cranial nerve deficit or sensory deficit. He displays a negative Romberg sign. Coordination and gait normal. GCS eye subscore is 4. GCS verbal subscore is 5. GCS motor subscore is 6.  CN 2-12 grossly intact A&O x4 GCS 15 Sensation and strength intact Gait nonataxic including with tandem walking Coordination with finger-to-nose WNL Neg romberg, neg pronator drift   Skin: Skin is warm and dry. Abrasion and laceration noted.  No rash noted.  L upper eyebrow lac as noted above. Abrasion to R thumb as noted above  Psychiatric: He has a normal mood and affect.  Nursing note and vitals reviewed.   ED Course  LACERATION REPAIR Date/Time: 06/02/2015 7:07 PM Performed by: Allen DerryAMPRUBI-SOMS, Kristi Hyer Authorized by: Allen DerryAMPRUBI-SOMS, Ileane Sando Consent: Verbal consent obtained. Risks and benefits: risks, benefits and alternatives were discussed Consent given by: patient Patient understanding: patient states understanding of the procedure being performed Patient consent: the patient's understanding of the procedure matches consent given Patient identity confirmed: verbally with patient Body area: head/neck Location details: left eyelid Laceration length: 1 cm Foreign bodies: no foreign bodies Tendon involvement: none Nerve involvement: none Vascular damage: no Anesthesia: local infiltration Local anesthetic: lidocaine 2% with epinephrine Anesthetic total: 1 ml Patient sedated: no Preparation: Patient was prepped and draped in the usual sterile fashion. Irrigation solution: saline Irrigation method: syringe Amount of cleaning: standard Debridement: none Degree of undermining: none Skin closure: 5-0 Prolene Number of sutures: 1 Technique: simple Approximation: close Approximation difficulty: simple Dressing: 4x4 sterile gauze Patient tolerance: Patient tolerated the procedure well with no immediate complications  LACERATION REPAIR Date/Time: 06/02/2015 7:08 PM Performed by: Allen DerryAMPRUBI-SOMS, Pieter Fooks Authorized by: Allen DerryAMPRUBI-SOMS, Nahom Carfagno Consent: Verbal consent obtained. Risks and benefits: risks, benefits and alternatives were discussed Consent given by: patient Patient understanding: patient states understanding of the procedure being performed Patient consent: the patient's understanding  of the procedure matches consent given Patient identity confirmed: verbally with patient Body area: upper  extremity Location details: right thumb Laceration length: 1 cm Foreign bodies: no foreign bodies Tendon involvement: none Nerve involvement: none Vascular damage: no Patient sedated: no Preparation: Patient was prepped and draped in the usual sterile fashion. Irrigation solution: saline Irrigation method: syringe Amount of cleaning: standard Debridement: none Degree of undermining: none Skin closure: Steri-Strips Approximation: close Approximation difficulty: simple Dressing: 4x4 sterile gauze and splint Patient tolerance: Patient tolerated the procedure well with no immediate complications   (including critical care time) Labs Review Labs Reviewed - No data to display  Imaging Review No results found.   EKG Interpretation None      MDM   Final diagnoses:  Eyebrow laceration, left, initial encounter  Thumb laceration, right, initial encounter  Fall, initial encounter  Concussion, without loss of consciousness, initial encounter    24 y.o. male here with fall and L eyebrow lac. No scalp tenderness or crepitus, no LOC. Small abrasion to R thumb. Nonfocal neuro exam, doubt need for head/face CT imaging. Will proceed with lac repair of eyebrow and steri strips to R thumb. Will give pain meds and update tetanus then reassess.  7:09 PM Lac repaired. Will give pain meds for home. Discussed wound care and concussion precautions. Will have him see his PCP in 7-10 days for suture removal. I explained the diagnosis and have given explicit precautions to return to the ER including for any other new or worsening symptoms. The patient understands and accepts the medical plan as it's been dictated and I have answered their questions. Discharge instructions concerning home care and prescriptions have been given. The patient is STABLE and is discharged to home in good condition.  BP 112/59 mmHg  Pulse 83  Temp(Src) 98.4 F (36.9 C) (Oral)  Resp 18  Ht  (1.803 m)  Wt 155 lb  (70.308 kg)  BMI 21.63 kg/m2  SpO2 95%  Meds ordered this encounter  Medications  . lidocaine-EPINEPHrine (XYLOCAINE W/EPI) 2 %-1:200000 (PF) injection 10 mL    Sig:   . Tdap (BOOSTRIX) injection 0.5 mL    Sig:   . HYDROcodone-acetaminophen (NORCO/VICODIN) 5-325 MG per tablet 1 tablet    Sig:   . naproxen (NAPROSYN) 500 MG tablet    Sig: Take 1 tablet (500 mg total) by mouth 2 (two) times daily as needed for mild pain, moderate pain or headache (TAKE WITH MEALS.).    Dispense:  20 tablet    Refill:  0    Order Specific Question:  Supervising Provider    Answer:  MILLER, BRIAN [[id number]]  . HYDROcodone-acetaminophen (NORCO) 5-325 MG per tablet    Sig: Take 1 tablet by mouth every 6 (six) hours as needed for severe pain.    Dispense:  6 tablet    Refill:  0    Order Specific Question:  Supervising Provider    Answer:  Eber Hong [[id number]]     Judyth Demarais Camprubi-Soms, PA-C 06/02/15 1912  Jerelyn Scott, MD 06/02/15 [phone]

## 2015-06-02 NOTE — Discharge Instructions (Signed)
Keep wound clean with mild soap and water. Keep area covered with a topical antibiotic ointment and bandage, keep bandage dry, and do not submerge in water for 24 hours. Ice and elevate for additional pain relief and swelling. Alternate between Naprosyn and norco for additional pain relief, don't drive while taking norco. Use steri strips and splint until your thumb laceration heals. Follow up with your primary care doctor or the [facility] in approximately 7-10 days for wound recheck and suture removal. Monitor area for signs of infection to include, but not limited to: increasing pain, redness, drainage/pus, or swelling. Return to emergency department for emergent changing or worsening symptoms.   Get plenty of rest, use ice on your head.  Stay in a quiet, not simulating, dark environment. No TV, computer use, video games until headache is resolved completely.  Follow Up with primary care physician in 3-4 days if headache persists.  Return to the emergency department if patient becomes lethargic, begins vomiting or other change in mental status.    Facial Laceration  A facial laceration is a cut on the face. These injuries can be painful and cause bleeding. Lacerations usually heal quickly, but they need special care to reduce scarring. DIAGNOSIS  Your health care provider will take a medical history, ask for details about how the injury occurred, and examine the wound to determine how deep the cut is. TREATMENT  Some facial lacerations may not require closure. Others may not be able to be closed because of an increased risk of infection. The risk of infection and the chance for successful closure will depend on various factors, including the amount of time since the injury occurred. The wound may be cleaned to help prevent infection. If closure is appropriate, pain medicines may be given if needed. Your health care provider will use stitches (sutures), wound glue (adhesive), or skin  adhesive strips to repair the laceration. These tools bring the skin edges together to allow for faster healing and a better cosmetic outcome. If needed, you may also be given a tetanus shot. HOME CARE INSTRUCTIONS  Only take over-the-counter or prescription medicines as directed by your health care provider.  Follow your health care provider's instructions for wound care. These instructions will vary depending on the technique used for closing the wound. For Sutures:  Keep the wound clean and dry.   If you were given a bandage (dressing), you should change it at least once a day. Also change the dressing if it becomes wet or dirty, or as directed by your health care provider.   Wash the wound with soap and water 2 times a day. Rinse the wound off with water to remove all soap. Pat the wound dry with a clean towel.   After cleaning, apply a thin layer of the antibiotic ointment recommended by your health care provider. This will help prevent infection and keep the dressing from sticking.   You may shower as usual after the first 24 hours. Do not soak the wound in water until the sutures are removed.   Get your sutures removed as directed by your health care provider. With facial lacerations, sutures should usually be taken out after 4-5 days to avoid stitch marks.   Wait a few days after your sutures are removed before applying any makeup. For Skin Adhesive Strips:  Keep the wound clean and dry.   Do not get the skin adhesive strips wet. You may bathe carefully, using caution to keep the  wound dry.   If the wound gets wet, pat it dry with a clean towel.   Skin adhesive strips will fall off on their own. You may trim the strips as the wound heals. Do not remove skin adhesive strips that are still stuck to the wound. They will fall off in time.  For Wound Adhesive:  You may briefly wet your wound in the shower or bath. Do not soak or scrub the wound. Do not swim. Avoid periods  of heavy sweating until the skin adhesive has fallen off on its own. After showering or bathing, gently pat the wound dry with a clean towel.   Do not apply liquid medicine, cream medicine, ointment medicine, or makeup to your wound while the skin adhesive is in place. This may loosen the film before your wound is healed.   If a dressing is placed over the wound, be careful not to apply tape directly over the skin adhesive. This may cause the adhesive to be pulled off before the wound is healed.   Avoid prolonged exposure to sunlight or tanning lamps while the skin adhesive is in place.  The skin adhesive will usually remain in place for 5-10 days, then naturally fall off the skin. Do not pick at the adhesive film.  After Healing: Once the wound has healed, cover the wound with sunscreen during the day for 1 full year. This can help minimize scarring. Exposure to ultraviolet light in the first year will darken the scar. It can take 1-2 years for the scar to lose its redness and to heal completely.  SEEK IMMEDIATE MEDICAL CARE IF:  You have redness, pain, or swelling around the wound.   You see ayellowish-white fluid (pus) coming from the wound.   You have chills or a fever.  MAKE SURE YOU:  Understand these instructions.  Will watch your condition.  Will get help right away if you are not doing well or get worse. Document Released: 01/20/2005 Document Revised: 10/03/2013 Document Reviewed: 07/26/2013 [facility] Patient Information 2015 [place], [place]. This information is not intended to replace advice given to you by your health care provider. Make sure you discuss any questions you have with your health care provider.  Laceration Care, Adult A laceration is a cut that goes through all layers of the skin. The cut goes into the tissue beneath the skin. HOME CARE For stitches (sutures) or staples:  Keep the cut clean and dry.  If you have a bandage (dressing), change it at  least once a day. Change the bandage if it gets wet or dirty, or as told by your doctor.  Wash the cut with soap and water 2 times a day. Rinse the cut with water. Pat it dry with a clean towel.  Put a thin layer of medicated cream on the cut as told by your doctor.  You may shower after the first 24 hours. Do not soak the cut in water until the stitches are removed.  Only take medicines as told by your doctor.  Have your stitches or staples removed as told by your doctor. For skin adhesive strips:  Keep the cut clean and dry.  Do not get the strips wet. You may take a bath, but be careful to keep the cut dry.  If the cut gets wet, pat it dry with a clean towel.  The strips will fall off on their own. Do not remove the strips that are still stuck to the cut. For wound glue:  You may shower or take baths. Do not soak or scrub the cut. Do not swim. Avoid heavy sweating until the glue falls off on its own. After a shower or bath, pat the cut dry with a clean towel.  Do not put medicine on your cut until the glue falls off.  If you have a bandage, do not put tape over the glue.  Avoid lots of sunlight or tanning lamps until the glue falls off. Put sunscreen on the cut for the first year to reduce your scar.  The glue will fall off on its own. Do not pick at the glue. You may need a tetanus shot if:  You cannot remember when you had your last tetanus shot.  You have never had a tetanus shot. If you need a tetanus shot and you choose not to have one, you may get tetanus. Sickness from tetanus can be serious. GET HELP RIGHT AWAY IF:   Your pain does not get better with medicine.  Your arm, hand, leg, or foot loses feeling (numbness) or changes color.  Your cut is bleeding.  Your joint feels weak, or you cannot use your joint.  You have painful lumps on your body.  Your cut is red, puffy (swollen), or painful.  You have a red line on the skin near the cut.  You have  yellowish-white fluid (pus) coming from the cut.  You have a fever.  You have a bad smell coming from the cut or bandage.  Your cut breaks open before or after stitches are removed.  You notice something coming out of the cut, such as wood or glass.  You cannot move a finger or toe. MAKE SURE YOU:   Understand these instructions.  Will watch your condition.  Will get help right away if you are not doing well or get worse. Document Released: 05/31/2008 Document Revised: 03/06/2012 Document Reviewed: 06/08/2011 [facility] Patient Information 2015 [place], [place]. This information is not intended to replace advice given to you by your health care provider. Make sure you discuss any questions you have with your health care provider.  Concussion A concussion, or closed-head injury, is a brain injury caused by a direct blow to the head or by a quick and sudden movement (jolt) of the head or neck. Concussions are usually not life-threatening. Even so, the effects of a concussion can be serious. If you have had a concussion before, you are more likely to experience concussion-like symptoms after a direct blow to the head.  CAUSES  Direct blow to the head, such as from running into another player during a soccer game, being hit in a fight, or hitting your head on a hard surface.  A jolt of the head or neck that causes the brain to move back and forth inside the skull, such as in a car crash. SIGNS AND SYMPTOMS The signs of a concussion can be hard to notice. Early on, they may be missed by you, family members, and health care providers. You may look fine but act or feel differently. Symptoms are usually temporary, but they may last for days, weeks, or even longer. Some symptoms may appear right away while others may not show up for hours or days. Every head injury is different. Symptoms include:  Mild to moderate headaches that will not go away.  A feeling of pressure inside your  head.  Having more trouble than usual:  Learning or remembering things you have heard.  Answering questions.  Paying  attention or concentrating.  Organizing daily tasks.  Making decisions and solving problems.  Slowness in thinking, acting or reacting, speaking, or reading.  Getting lost or being easily confused.  Feeling tired all the time or lacking energy (fatigued).  Feeling drowsy.  Sleep disturbances.  Sleeping more than usual.  Sleeping less than usual.  Trouble falling asleep.  Trouble sleeping (insomnia).  Loss of balance or feeling lightheaded or dizzy.  Nausea or vomiting.  Numbness or tingling.  Increased sensitivity to:  Sounds.  Lights.  Distractions.  Vision problems or eyes that tire easily.  Diminished sense of taste or smell.  Ringing in the ears.  Mood changes such as feeling sad or anxious.  Becoming easily irritated or angry for little or no reason.  Lack of motivation.  Seeing or hearing things other people do not see or hear (hallucinations). DIAGNOSIS Your health care provider can usually diagnose a concussion based on a description of your injury and symptoms. He or she will ask whether you passed out (lost consciousness) and whether you are having trouble remembering events that happened right before and during your injury. Your evaluation might include:  A brain scan to look for signs of injury to the brain. Even if the test shows no injury, you may still have a concussion.  Blood tests to be sure other problems are not present. TREATMENT  Concussions are usually treated in an emergency department, in urgent care, or at a clinic. You may need to stay in the hospital overnight for further treatment.  Tell your health care provider if you are taking any medicines, including prescription medicines, over-the-counter medicines, and natural remedies. Some medicines, such as blood thinners (anticoagulants) and aspirin, may  increase the chance of complications. Also tell your health care provider whether you have had alcohol or are taking illegal drugs. This information may affect treatment.  Your health care provider will send you home with important instructions to follow.  How fast you will recover from a concussion depends on many factors. These factors include how severe your concussion is, what part of your brain was injured, your age, and how healthy you were before the concussion.  Most people with mild injuries recover fully. Recovery can take time. In general, recovery is slower in older persons. Also, persons who have had a concussion in the past or have other medical problems may find that it takes longer to recover from their current injury. HOME CARE INSTRUCTIONS General Instructions  Carefully follow the directions your health care provider gave you.  Only take over-the-counter or prescription medicines for pain, discomfort, or fever as directed by your health care provider.  Take only those medicines that your health care provider has approved.  Do not drink alcohol until your health care provider says you are well enough to do so. Alcohol and certain other drugs may slow your recovery and can put you at risk of further injury.  If it is harder than usual to remember things, write them down.  If you are easily distracted, try to do one thing at a time. For example, do not try to watch TV while fixing dinner.  Talk with family members or close friends when making important decisions.  Keep all follow-up appointments. Repeated evaluation of your symptoms is recommended for your recovery.  Watch your symptoms and tell others to do the same. Complications sometimes occur after a concussion. Older adults with a brain injury may have a higher risk of serious complications, such  as a blood clot on the brain.  Tell your teachers, school nurse, school counselor, coach, athletic trainer, or work  Production designer, theatre/television/film about your injury, symptoms, and restrictions. Tell them about what you can or cannot do. They should watch for:  Increased problems with attention or concentration.  Increased difficulty remembering or learning new information.  Increased time needed to complete tasks or assignments.  Increased irritability or decreased ability to cope with stress.  Increased symptoms.  Rest. Rest helps the brain to heal. Make sure you:  Get plenty of sleep at night. Avoid staying up late at night.  Keep the same bedtime hours on weekends and weekdays.  Rest during the day. Take daytime naps or rest breaks when you feel tired.  Limit activities that require a lot of thought or concentration. These include:  Doing homework or job-related work.  Watching TV.  Working on the computer.  Avoid any situation where there is potential for another head injury (football, hockey, soccer, basketball, martial arts, downhill snow sports and horseback riding). Your condition will get worse every time you experience a concussion. You should avoid these activities until you are evaluated by the appropriate follow-up health care providers. Returning To Your Regular Activities You will need to return to your normal activities slowly, not all at once. You must give your body and brain enough time for recovery.  Do not return to sports or other athletic activities until your health care provider tells you it is safe to do so.  Ask your health care provider when you can drive, ride a bicycle, or operate heavy machinery. Your ability to react may be slower after a brain injury. Never do these activities if you are dizzy.  Ask your health care provider about when you can return to work or school. Preventing Another Concussion It is very important to avoid another brain injury, especially before you have recovered. In rare cases, another injury can lead to permanent brain damage, brain swelling, or death. The  risk of this is greatest during the first 7-10 days after a head injury. Avoid injuries by:  Wearing a seat belt when riding in a car.  Drinking alcohol only in moderation.  Wearing a helmet when biking, skiing, skateboarding, skating, or doing similar activities.  Avoiding activities that could lead to a second concussion, such as contact or recreational sports, until your health care provider says it is okay.  Taking safety measures in your home.  Remove clutter and tripping hazards from floors and stairways.  Use grab bars in bathrooms and handrails by stairs.  Place non-slip mats on floors and in bathtubs.  Improve lighting in dim areas. SEEK MEDICAL CARE IF:  You have increased problems paying attention or concentrating.  You have increased difficulty remembering or learning new information.  You need more time to complete tasks or assignments than before.  You have increased irritability or decreased ability to cope with stress.  You have more symptoms than before. Seek medical care if you have any of the following symptoms for more than 2 weeks after your injury:  Lasting (chronic) headaches.  Dizziness or balance problems.  Nausea.  Vision problems.  Increased sensitivity to noise or light.  Depression or mood swings.  Anxiety or irritability.  Memory problems.  Difficulty concentrating or paying attention.  Sleep problems.  Feeling tired all the time. SEEK IMMEDIATE MEDICAL CARE IF:  You have severe or worsening headaches. These may be a sign of a blood clot in the  brain.  You have weakness (even if only in one hand, leg, or part of the face).  You have numbness.  You have decreased coordination.  You vomit repeatedly.  You have increased sleepiness.  One pupil is larger than the other.  You have convulsions.  You have slurred speech.  You have increased confusion. This may be a sign of a blood clot in the brain.  You have increased  restlessness, agitation, or irritability.  You are unable to recognize people or places.  You have neck pain.  It is difficult to wake you up.  You have unusual behavior changes.  You lose consciousness. MAKE SURE YOU:  Understand these instructions.  Will watch your condition.  Will get help right away if you are not doing well or get worse. Document Released: 03/04/2004 Document Revised: 12/18/2013 Document Reviewed: 07/05/2013 [facility] Patient Information 2015 [place], [place]. This information is not intended to replace advice given to you by your health care provider. Make sure you discuss any questions you have with your health care provider.

## 2015-06-10 ENCOUNTER — Encounter (HOSPITAL_BASED_OUTPATIENT_CLINIC_OR_DEPARTMENT_OTHER): Payer: Self-pay | Admitting: Emergency Medicine

## 2015-06-10 ENCOUNTER — Emergency Department (HOSPITAL_BASED_OUTPATIENT_CLINIC_OR_DEPARTMENT_OTHER)
Admission: EM | Admit: 2015-06-10 | Discharge: 2015-06-10 | Disposition: A | Discharge status: Home / Self Care | Payer: BLUE CROSS/BLUE SHIELD | Attending: Emergency Medicine | Admitting: Emergency Medicine

## 2015-06-10 DIAGNOSIS — G43909 Migraine, unspecified, not intractable, without status migrainosus: Secondary | ICD-10-CM | POA: Diagnosis not present

## 2015-06-10 DIAGNOSIS — Z720 Tobacco use: Secondary | ICD-10-CM | POA: Diagnosis not present

## 2015-06-10 DIAGNOSIS — Z79899 Other long term (current) drug therapy: Secondary | ICD-10-CM | POA: Diagnosis not present

## 2015-06-10 DIAGNOSIS — F909 Attention-deficit hyperactivity disorder, unspecified type: Secondary | ICD-10-CM | POA: Insufficient documentation

## 2015-06-10 DIAGNOSIS — Z791 Long term (current) use of non-steroidal anti-inflammatories (NSAID): Secondary | ICD-10-CM | POA: Diagnosis not present

## 2015-06-10 DIAGNOSIS — R51 Headache: Secondary | ICD-10-CM

## 2015-06-10 DIAGNOSIS — R519 Headache, unspecified: Secondary | ICD-10-CM

## 2015-06-10 HISTORY — DX: Syphilis, unspecified: A53.9

## 2015-06-10 MED ORDER — KETOROLAC TROMETHAMINE 30 MG/ML IJ SOLN
30.0000 mg | Freq: Once | INTRAMUSCULAR | Status: AC
  Administered 2015-06-10: 30 mg via INTRAVENOUS
  Filled 2015-06-10: qty 1

## 2015-06-10 MED ORDER — DIPHENHYDRAMINE HCL 50 MG/ML IJ SOLN
25.0000 mg | Freq: Once | INTRAMUSCULAR | Status: AC
  Administered 2015-06-10: 25 mg via INTRAVENOUS
  Filled 2015-06-10: qty 1

## 2015-06-10 MED ORDER — METOCLOPRAMIDE HCL 5 MG/ML IJ SOLN
10.0000 mg | Freq: Once | INTRAMUSCULAR | Status: AC
  Administered 2015-06-10: 10 mg via INTRAVENOUS
  Filled 2015-06-10: qty 2

## 2015-06-10 MED ORDER — SODIUM CHLORIDE 0.9 % IV BOLUS (SEPSIS)
1000.0000 mL | Freq: Once | INTRAVENOUS | Status: AC
  Administered 2015-06-10: 1000 mL via INTRAVENOUS

## 2015-06-10 NOTE — ED Notes (Signed)
MD at bedside. 

## 2015-06-10 NOTE — ED Provider Notes (Signed)
CSN: [id number]     Arrival date & time 06/10/15  0304 History   First MD Initiated Contact with Patient 06/10/15 0315     Chief Complaint  Patient presents with  . Migraine     (Consider location/radiation/quality/duration/timing/severity/associated sxs/prior Treatment) HPI  This is a 24 year old male with a remote history of migraines. He is here with a frontal headache associated with severe photophobia that began yesterday. Pain is worse with exposure to light or movement of his head. There is no associated nausea or vomiting. There is no associated focal neurologic deficit. He does not know if his vision is blurred because he has been keeping his eyes closed due to photophobia. He has taken ibuprofen without relief.  Past Medical History  Diagnosis Date  . Migraine   . ADHD (attention deficit hyperactivity disorder)    History reviewed. No pertinent past surgical history. No family history on file. History  Substance Use Topics  . Smoking status: Current Every Day Smoker -- 0.25 packs/day    Types: Cigarettes  . Smokeless tobacco: Never Used  . Alcohol Use: Yes     Comment: social    Review of Systems  All other systems reviewed and are negative.   Allergies  Review of patient's allergies indicates no known allergies.  Home Medications   Prior to Admission medications   Medication Sig Start Date End Date Taking? Authorizing Provider  cyclobenzaprine (FLEXERIL) 10 MG tablet Take 1 tablet (10 mg total) by mouth 3 (three) times daily as needed for muscle spasms. 04/07/15   [name], PA-C  docusate sodium (COLACE) 100 MG capsule Take 1 capsule (100 mg total) by mouth 2 (two) times daily. 03/27/15   Dorna Leitz, PA-C  HYDROcodone-acetaminophen (NORCO) 5-325 MG per tablet Take 1 tablet by mouth every 6 (six) hours as needed. Patient not taking: Reported on 04/07/2015 03/27/15   Dorna Leitz, PA-C  HYDROcodone-acetaminophen (NORCO) 5-325 MG per tablet Take 1 tablet by mouth  every 6 (six) hours as needed for severe pain. 06/02/15   Mercedes Camprubi-Soms, PA-C  meloxicam (MOBIC) 15 MG tablet Take 1 tablet (15 mg total) by mouth daily. 04/07/15   [name], PA-C  methylphenidate (RITALIN) 10 MG tablet Take 1 tablet (10 mg total) by mouth 2 (two) times daily. 05/14/15 09/08/16  Dorna Leitz, PA-C  naproxen (NAPROSYN) 500 MG tablet Take 1 tablet (500 mg total) by mouth 2 (two) times daily as needed for mild pain, moderate pain or headache (TAKE WITH MEALS.). 06/02/15   Mercedes Camprubi-Soms, PA-C   BP 115/71 mmHg  Pulse 50  Temp(Src) 98 F (36.7 C) (Oral)  Resp 18  Ht 5\' 10"  (1.778 m)  Wt 147 lb (66.679 kg)  BMI 21.09 kg/m2  SpO2 100%   Physical Exam  General: Well-developed, well-nourished male in no acute distress; appearance consistent with age of record HENT: normocephalic; atraumatic Eyes: pupils equal, round and reactive to light; extraocular muscles intact; mild conjunctival injection bilaterally; photophobia Neck: supple Heart: regular rate and rhythm Lungs: clear to auscultation bilaterally Abdomen: soft; nondistended Extremities: No deformity; full range of motion; pulses normal Neurologic: Awake, alert and oriented; motor function intact in all extremities and symmetric; no facial droop Skin: Warm and dry Psychiatric: Flat affect    ED Course  Procedures (including critical care time)   MDM  4:47 AM Headache and photophobia relieved with IV medications. Patient ready to go home.  Paula Libra, MD 06/10/15 [phone]

## 2015-06-10 NOTE — ED Notes (Signed)
Migraine with light sensitivity since yesterday. Reports taking 500 Ibuprofen and removed contact lenses w/ no relief. Denies N/V

## 2015-07-03 ENCOUNTER — Telehealth: Payer: Self-pay

## 2015-07-03 DIAGNOSIS — K645 Perianal venous thrombosis: Secondary | ICD-10-CM

## 2015-07-03 DIAGNOSIS — F902 Attention-deficit hyperactivity disorder, combined type: Secondary | ICD-10-CM

## 2015-07-03 DIAGNOSIS — K5900 Constipation, unspecified: Secondary | ICD-10-CM

## 2015-07-03 MED ORDER — DOCUSATE SODIUM 100 MG PO CAPS: 100.0000 mg | ORAL_CAPSULE | Freq: Two times a day (BID) | ORAL | Status: DC | PRN

## 2015-07-03 MED ORDER — METHYLPHENIDATE HCL 10 MG PO TABS: 10.0000 mg | ORAL_TABLET | Freq: Two times a day (BID) | ORAL | Status: DC

## 2015-07-03 NOTE — Telephone Encounter (Signed)
Left message Rx's were ready to pick. Up.

## 2015-07-03 NOTE — Telephone Encounter (Signed)
Patient needs a refill for ritalin and also for a laxetative medication he forgot the name of. [facility] [address] [place]

## 2015-07-03 NOTE — Telephone Encounter (Signed)
Colace

## 2015-07-03 NOTE — Telephone Encounter (Signed)
Colace sent to pharmacy. Ritalin ready for pick up.

## 2015-08-01 ENCOUNTER — Telehealth: Payer: Self-pay

## 2015-08-01 DIAGNOSIS — F902 Attention-deficit hyperactivity disorder, combined type: Secondary | ICD-10-CM

## 2015-08-01 NOTE — Telephone Encounter (Signed)
Pt is requesting a refill of RITALIN.

## 2015-08-03 MED ORDER — METHYLPHENIDATE HCL 10 MG PO TABS: 10.0000 mg | ORAL_TABLET | Freq: Two times a day (BID) | ORAL | Status: DC

## 2015-08-03 NOTE — Telephone Encounter (Signed)
Rx ready for pick up. He should return for follow up in 1 month.

## 2015-08-04 NOTE — Telephone Encounter (Signed)
Notified pt of RF and need for f/up. Pt agreed. 

## 2015-08-12 ENCOUNTER — Telehealth: Payer: Self-pay

## 2015-08-12 NOTE — Telephone Encounter (Signed)
Edward Dunn are you willing to write this?

## 2015-08-12 NOTE — Telephone Encounter (Signed)
Pt needs medical documentation for school for when he started Ritalin and why. He needs the letter by tomorrow when picks up his prescription. Please contact him just to make sure that is all he needs in his medical documentation letter.  Advise  [phone]

## 2015-08-13 NOTE — Telephone Encounter (Signed)
Called pt and left VM to see what exactly he needs. I have written a letter for him -- when he picks up he will read over and let me know if he needs anything else. Letter ready for pick up -- in nurse box.

## 2015-08-14 NOTE — Telephone Encounter (Signed)
Left message letter ready pick up.

## 2015-09-22 ENCOUNTER — Telehealth: Payer: Self-pay

## 2015-09-22 NOTE — Telephone Encounter (Signed)
Pt requesting a document that may be in his chart from 2013 that shows he was seen here and that documentation was sent to his school?   Best phone for pt is [phone]

## 2015-09-25 NOTE — Telephone Encounter (Signed)
Patient left voicemail today at 10:49am requesting a call back. He says he called 3 days ago requesting records and never received a call back. The message was taken but never routed to medical records. Please call patient at [phone].

## 2015-09-25 NOTE — Telephone Encounter (Signed)
Left message for patient to call back  

## 2015-09-26 NOTE — Telephone Encounter (Signed)
Left another message to call back

## 2015-09-29 NOTE — Telephone Encounter (Signed)
Patient left VM requesting a call back.  I called patient and he requested his records.  Advised patient as soon as we received the ROI with the pertinent information we would be happy to provide these to him.

## 2015-09-30 NOTE — Telephone Encounter (Signed)
Left message to call back. Patient does not need to come in and sign a ROI form. Medical records can complete a verbal request form and process request, but I need to know which records he is requesting.

## 2015-09-30 NOTE — Telephone Encounter (Signed)
Faxed ROI found in releases folder. Will process request.

## 2015-09-30 NOTE — Telephone Encounter (Signed)
Patient returned call today at 3:38pm and stating he wanted all records. Records ready for pickup. He will pick up this evening after work.

## 2015-10-01 NOTE — Telephone Encounter (Signed)
Confirmation received.

## 2015-10-01 NOTE — Telephone Encounter (Signed)
Patient left voicemail today at 11:58am and 12:35pm requesting call back. Spoke with patient and he says he has car trouble and is unable to pick up records. He asked if we could fax them instead to [phone] attn [place] (this is mom's fax #). Records and letter from [facility] both faxed. Waiting on confirmation.

## 2015-11-12 ENCOUNTER — Telehealth: Payer: Self-pay

## 2015-11-12 NOTE — Telephone Encounter (Signed)
Pt is needing a refill on her ritalin pt does not have transpotration so he is unable to come in but if it is written he can get someone to come and pick it up     Best number [phone]

## 2015-11-17 NOTE — Telephone Encounter (Signed)
LM to RTC

## 2015-11-17 NOTE — Telephone Encounter (Signed)
Unfortunately, he will need to find a way to return for an OV since it has been almost 8 months since I last saw him. Please remind him that the maximum amount of time between OVs for continuing to get ritalin refills is 6 months. He is two months overdue for a follow up appt.

## 2015-11-17 NOTE — Telephone Encounter (Signed)
Left message for pt to call back  °

## 2016-06-04 ENCOUNTER — Emergency Department (HOSPITAL_BASED_OUTPATIENT_CLINIC_OR_DEPARTMENT_OTHER)
Admission: EM | Admit: 2016-06-04 | Discharge: 2016-06-04 | Disposition: A | Discharge status: Home / Self Care | Payer: BLUE CROSS/BLUE SHIELD | Attending: Emergency Medicine | Admitting: Emergency Medicine

## 2016-06-04 ENCOUNTER — Encounter (HOSPITAL_BASED_OUTPATIENT_CLINIC_OR_DEPARTMENT_OTHER): Payer: Self-pay

## 2016-06-04 DIAGNOSIS — R51 Headache: Secondary | ICD-10-CM | POA: Insufficient documentation

## 2016-06-04 DIAGNOSIS — F17210 Nicotine dependence, cigarettes, uncomplicated: Secondary | ICD-10-CM | POA: Insufficient documentation

## 2016-06-04 DIAGNOSIS — K0889 Other specified disorders of teeth and supporting structures: Secondary | ICD-10-CM | POA: Insufficient documentation

## 2016-06-04 MED ORDER — IBUPROFEN 800 MG PO TABS
800.0000 mg | ORAL_TABLET | Freq: Once | ORAL | Status: AC
  Administered 2016-06-04: 800 mg via ORAL
  Filled 2016-06-04: qty 1

## 2016-06-04 MED ORDER — PENICILLIN V POTASSIUM 500 MG PO TABS
500.0000 mg | ORAL_TABLET | Freq: Four times a day (QID) | ORAL | Status: AC
Start: 2016-06-04 — End: 2016-06-11

## 2016-06-04 MED ORDER — HYDROCODONE-ACETAMINOPHEN 5-325 MG PO TABS: 2.0000 | ORAL_TABLET | ORAL | Status: DC | PRN

## 2016-06-04 NOTE — Discharge Instructions (Signed)
Take penicillin as prescribed for 1 week. Take Norco every 4 hours as needed for severe pain. Take ibuprofen every 4-6 hours for more mild pain. Please call a dentist indicated above as soon as possible for further evaluation and treatment. You can use the dental resource guide below also. The dentists outlined above in your discharge paperwork should be open on Saturdays.  [employer] [facility] Dental The [employer] 211 is a great source of information about community services available.  Access by dialing 2-1-1 from anywhere in [place], or by website -  PooledIncome.plwww.nc211.org.   Other Local Resources (Updated 12/2015)  Dental  Care   Services    Phone Number and Address  Cost  [facility] For children 620 - 26 years of age:   Cleaning  Tooth brushing/flossing instruction  Sealants, fillings, crowns  Extractions  Emergency treatment  [phone] [address] [place], [place] [address] Charges based on family income.  Medicaid and some insurance plans accepted.     [facility]  Cleaning  Sealants, fillings, crowns  Extractions  Emergency treatment [phone] [address] [place], [place]  Pregnant women 26 years of age or older with a Medicaid card  [facility] [place]  Cleaning  Sealants, fillings, crowns  Extractions  Emergency treatment [phone] [address] [place], [place] Pregnant women 26 years of age or older with a Medicaid card  [facility] For children 570 - 26 years of age:   Cleaning  Tooth brushing/flossing instruction  Sealants, fillings, crowns  Extractions  Emergency treatment Limited orthodontic services for patients with Medicaid [phone] [address] [place], [place] [address] Medicaid and [facility] Health Choice cover for children up to age 26 and  pregnant women.  Parents of children up to age 26 without Medicaid pay a reduced fee at time of service.  [facility] [place] For children 20 - 26 years of age:   Cleaning  Tooth brushing/flossing instruction  Sealants, fillings, crowns  Extractions  Emergency treatment Limited orthodontic services for patients with Medicaid [phone] [address] [place], [place].  Medicaid and [place] Health Choice cover for children up to age 26 and pregnant women.  Parents of children up to age 26 without Medicaid pay a reduced fee.  [facility]  Cleaning  Sealants, fillings, crowns  Extractions  Hours: Tuesdays and Thursdays, 4:15 - 8 pm [phone] [address], [address] [place], [place] [address] Services free of charge to [facility] residents ages 18-64 who do not have health insurance, Medicare, [place], or [place] benefits and fall within federal poverty guidelines  [facility]    Provides dental care in addition to primary medical care, nutritional counseling, and pharmacy:  Nurse, mental healthCleaning  Sealants, fillings, crowns  Extractions                  [phone] [facility], [address] [place], [place]  [phone] [facility], [address] [place], [place]  [phone] [facility] [place], [place]  [phone] [facility], [address] [place], [place]  [phone] [facility] [address] [place], [place] Accepts [place], [place]are, most insurance.  Also provides services available to all with fees adjusted based on ability to pay.  [facility]  Cleaning  Tooth brushing/flossing instruction  Sealants, fillings, crowns  Extractions  Emergency treatment Hours: Tuesdays, Thursdays, and Fridays from 8  am to 5 pm by appointment only. [phone] [address] [place], [place] [address] [place] residents with Medicaid (depending on eligibility) and children with [facility] Health Choice - call for more information.  [facility]  Extractions only  Hours: 2nd and 4th Thursday of each month from 6:30 am - 9 am.   [phone] ext. [phone] [place], [place] [address] Ages 61 and older only.  Patients are seen on a first come, first served basis.  [employer]  [employer]  Extractions  Orthodontics  Endodontics  Implants/Crowns/Bridges  Complete and partial dentures [phone] [place], [place] Patients must complete an application for services.  There is often a waiting list.

## 2016-06-04 NOTE — ED Notes (Signed)
Right lower toothache x 2 days-NAD-steady gait

## 2016-06-04 NOTE — ED Notes (Signed)
Pt teaching provided on medications that may cause drowsiness. Pt instructed not to drive or operate heavy machinery while taking the prescribed medication. Pt verbalized understanding.   

## 2016-06-05 NOTE — ED Provider Notes (Signed)
CSN: [id number]     Arrival date & time 06/04/16  1656 History   First MD Initiated Contact with Patient 06/04/16 1916     Chief Complaint  Patient presents with  . Dental Pain     (Consider location/radiation/quality/duration/timing/severity/associated sxs/prior Treatment) HPI Comments: Patient is a 25 year old male who presents with dental pain. Patient reports he thinks he chipped his bottom right molar yesterday morning. Patient reports he has had severe pain and sensitivity to air and extreme temperatures since. He describes his pain as throbbing and rates his pain as a 10 out of 10. Patient has had associated gradual headache to the side of his tooth pain, the right side. Patient denies any fevers, chest pain, shortness of breath, abdominal pain, nausea, vomiting, dysuria.  Patient is a 25 y.o. male presenting with tooth pain. The history is provided by the patient.  Dental Pain Associated symptoms: no facial swelling, no fever and no headaches     Past Medical History  Diagnosis Date  . Migraine   . ADHD (attention deficit hyperactivity disorder)   . Syphilis    History reviewed. No pertinent past surgical history. No family history on file. Social History  Substance Use Topics  . Smoking status: Current Every Day Smoker -- 0.25 packs/day    Types: Cigarettes  . Smokeless tobacco: Never Used  . Alcohol Use: Yes     Comment: social    Review of Systems  Constitutional: Negative for fever and chills.  HENT: Positive for dental problem. Negative for facial swelling and sore throat.   Respiratory: Negative for shortness of breath.   Cardiovascular: Negative for chest pain.  Gastrointestinal: Negative for nausea, vomiting and abdominal pain.  Genitourinary: Negative for dysuria.  Musculoskeletal: Negative for back pain.  Skin: Negative for rash and wound.  Neurological: Negative for headaches.  Psychiatric/Behavioral: The patient is not nervous/anxious.        Allergies  Review of patient's allergies indicates no known allergies.  Home Medications   Prior to Admission medications   Medication Sig Start Date End Date Taking? Authorizing Provider  HYDROcodone-acetaminophen (NORCO/VICODIN) 5-325 MG tablet Take 2 tablets by mouth every 4 (four) hours as needed. 06/04/16   Emi Holes, PA-C  methylphenidate (RITALIN) 10 MG tablet Take 1 tablet (10 mg total) by mouth 2 (two) times daily. 08/03/15 11/28/16  Dorna Leitz, PA-C  penicillin v potassium (VEETID) 500 MG tablet Take 1 tablet (500 mg total) by mouth 4 (four) times daily. 06/04/16 06/11/16  Shakita Keir M Gloriajean Okun, PA-C   BP 130/77 mmHg  Pulse 60  Temp(Src) 99.1 F (37.3 C) (Oral)  Resp 18  Ht  (1.803 m)  Wt 74.844 kg  BMI 23.02 kg/m2  SpO2 100% Physical Exam  Constitutional: He appears well-developed and well-nourished. No distress.  HENT:  Head: Normocephalic and atraumatic.  Mouth/Throat: Oropharynx is clear and moist. Abnormal dentition. No oropharyngeal exudate, posterior oropharyngeal edema, posterior oropharyngeal erythema or tonsillar abscesses.    No significant gingival erythema or edema, no abscess noted  Eyes: Conjunctivae are normal. Pupils are equal, round, and reactive to light. Right eye exhibits no discharge. Left eye exhibits no discharge. No scleral icterus.  Neck: Normal range of motion. Neck supple. No thyromegaly present.  Cardiovascular: Normal rate, regular rhythm, normal heart sounds and intact distal pulses.  Exam reveals no gallop and no friction rub.   No murmur heard. Pulmonary/Chest: Effort normal and breath sounds normal. No stridor. No respiratory distress. He has no wheezes.  He has no rales.  Abdominal: Soft. Bowel sounds are normal. He exhibits no distension. There is no tenderness. There is no rebound and no guarding.  Musculoskeletal: He exhibits no edema.  Lymphadenopathy:    He has no cervical adenopathy.  Neurological: He is alert.  Coordination normal.  Skin: Skin is warm and dry. No rash noted. He is not diaphoretic. No pallor.  Psychiatric: He has a normal mood and affect.  Nursing note and vitals reviewed.   ED Course  Procedures (including critical care time) Labs Review Labs Reviewed - No data to display  Imaging Review No results found. I have personally reviewed and evaluated these images and lab results as part of my medical decision-making.   EKG Interpretation None      MDM   Patient with dental fracture to lower right molar. I treated tooth with Dentin paste in ED. I discharged patient home with penicillin and pain medication. I counseled patient not to drive or operate machinery when taking his pain medication. I stressed the importance of following up with a dentist as soon as possible. I gave patient resources and dental offices that have Saturday hours so the patient could be seen as soon as possible. Patient understands and agrees with plan. Patient vitals stable throughout ED course and discharged in satisfactory condition. I discussed this patient with Dr. Cyndie ChimeNguyen who is in agreement with plan.  Final diagnoses:  Pain, dental       Emi Holeslexandra M Ishmel Acevedo, PA-C 06/05/16 1600  Leta BaptistEmily Roe Nguyen, MD 06/07/16 2325

## 2016-07-28 ENCOUNTER — Ambulatory Visit (INDEPENDENT_AMBULATORY_CARE_PROVIDER_SITE_OTHER): Payer: PRIVATE HEALTH INSURANCE | Admitting: Urgent Care

## 2016-07-28 DIAGNOSIS — F902 Attention-deficit hyperactivity disorder, combined type: Secondary | ICD-10-CM | POA: Diagnosis not present

## 2016-07-28 MED ORDER — METHYLPHENIDATE HCL 10 MG PO TABS: 10.0000 mg | ORAL_TABLET | Freq: Two times a day (BID) | ORAL | 0 refills | Status: DC

## 2016-07-28 NOTE — Patient Instructions (Addendum)
Attention Deficit Hyperactivity Disorder Attention deficit hyperactivity disorder (ADHD) is a problem with behavior issues based on the way the brain functions (neurobehavioral disorder). It is a common reason for behavior and academic problems in school. SYMPTOMS  There are 3 types of ADHD. The 3 types and some of the symptoms include:  Inattentive.  Gets bored or distracted easily.  Loses or forgets things. Forgets to hand in homework.  Has trouble organizing or completing tasks.  Difficulty staying on task.  An inability to organize daily tasks and school work.  Leaving projects, chores, or homework unfinished.  Trouble paying attention or responding to details. Careless mistakes.  Difficulty following directions. Often seems like is not listening.  Dislikes activities that require sustained attention (like chores or homework).  Hyperactive-impulsive.  Feels like it is impossible to sit still or stay in a seat. Fidgeting with hands and feet.  Trouble waiting turn.  Talking too much or out of turn. Interruptive.  Speaks or acts impulsively.  Aggressive, disruptive behavior.  Constantly busy or on the go; noisy.  Often leaves seat when they are expected to remain seated.  Often runs or climbs where it is not appropriate, or feels very restless.  Combined.  Has symptoms of both of the above. Often children with ADHD feel discouraged about themselves and with school. They often perform well below their abilities in school. As children get older, the excess motor activities can calm down, but the problems with paying attention and staying organized persist. Most children do not outgrow ADHD but with good treatment can learn to cope with the symptoms. DIAGNOSIS  When ADHD is suspected, the diagnosis should be made by professionals trained in ADHD. This professional will collect information about the individual suspected of having ADHD. Information must be collected from  various settings where the person lives, works, or attends school.  Diagnosis will include:  Confirming symptoms began in childhood.  Ruling out other reasons for the child's behavior.  The health care providers will check with the child's school and check their medical records.  They will talk to teachers and parents.  Behavior rating scales for the child will be filled out by those dealing with the child on a daily basis. A diagnosis is made only after all information has been considered. TREATMENT  Treatment usually includes behavioral treatment, tutoring or extra support in school, and stimulant medicines. Because of the way a person's brain works with ADHD, these medicines decrease impulsivity and hyperactivity and increase attention. This is different than how they would work in a person who does not have ADHD. Other medicines used include antidepressants and certain blood pressure medicines. Most experts agree that treatment for ADHD should address all aspects of the person's functioning. Along with medicines, treatment should include structured classroom management at school. Parents should reward good behavior, provide constant discipline, and set limits. Tutoring should be available for the child as needed. ADHD is a lifelong condition. If untreated, the disorder can have long-term serious effects into adolescence and adulthood. HOME CARE INSTRUCTIONS   Often with ADHD there is a lot of frustration among family members dealing with the condition. Blame and anger are also feelings that are common. In many cases, because the problem affects the family as a whole, the entire family may need help. A therapist can help the family find better ways to handle the disruptive behaviors of the person with ADHD and promote change. If the person with ADHD is young, most of the therapist's   work is with the parents. Parents will learn techniques for coping with and improving their child's behavior.  Sometimes only the child with the ADHD needs counseling. Your health care providers can help you make these decisions.  Children with ADHD may need help learning how to organize. Some helpful tips include:  Keep routines the same every day from wake-up time to bedtime. Schedule all activities, including homework and playtime. Keep the schedule in a place where the person with ADHD will often see it. Mark schedule changes as far in advance as possible.  Schedule outdoor and indoor recreation.  Have a place for everything and keep everything in its place. This includes clothing, backpacks, and school supplies.  Encourage writing down assignments and bringing home needed books. Work with your child's teachers for assistance in organizing school work.  Offer your child a well-balanced diet. Breakfast that includes a balance of whole grains, protein, and fruits or vegetables is especially important for school performance. Children should avoid drinks with caffeine including:  Soft drinks.  Coffee.  Tea.  However, some older children (adolescents) may find these drinks helpful in improving their attention. Because it can also be common for adolescents with ADHD to become addicted to caffeine, talk with your health care provider about what is a safe amount of caffeine intake for your child.  Children with ADHD need consistent rules that they can understand and follow. If rules are followed, give small rewards. Children with ADHD often receive, and expect, criticism. Look for good behavior and praise it. Set realistic goals. Give clear instructions. Look for activities that can foster success and self-esteem. Make time for pleasant activities with your child. Give lots of affection.  Parents are their children's greatest advocates. Learn as much as possible about ADHD. This helps you become a stronger and better advocate for your child. It also helps you educate your child's teachers and instructors  if they feel inadequate in these areas. Parent support groups are often helpful. A national group with local chapters is called Children and Adults with Attention Deficit Hyperactivity Disorder (CHADD). SEEK MEDICAL CARE IF:  Your child has repeated muscle twitches, cough, or speech outbursts.  Your child has sleep problems.  Your child has a marked loss of appetite.  Your child develops depression.  Your child has new or worsening behavioral problems.  Your child develops dizziness.  Your child has a racing heart.  Your child has stomach pains.  Your child develops headaches. SEEK IMMEDIATE MEDICAL CARE IF:  Your child has been diagnosed with depression or anxiety and the symptoms seem to be getting worse.  Your child has been depressed and suddenly appears to have increased energy or motivation.  You are worried that your child is having a bad reaction to a medication he or she is taking for ADHD.   This information is not intended to replace advice given to you by your health care provider. Make sure you discuss any questions you have with your health care provider.   Document Released: 12/03/2002 Document Revised: 12/18/2013 Document Reviewed: 08/20/2013 Elsevier Interactive Patient Education 2016 [employer].    Methylphenidate tablets What is this medicine? METHYLPHENIDATE (meth il FEN i date) is used to treat attention-deficit hyperactivity disorder (ADHD). It is also used to treat narcolepsy. This medicine may be used for other purposes; ask your health care provider or pharmacist if you have questions. What should I tell my health care provider before I take this medicine? They need to know  if you have any of these conditions: -anxiety or panic attacks -circulation problems in fingers and toes -glaucoma -hardening or blockages of the arteries or heart blood vessels -heart disease or a heart defect -high blood pressure -history of a drug or alcohol abuse  problem -history of stroke -liver disease -mental illness -motor tics, family history or diagnosis of Tourette's syndrome -seizures -suicidal thoughts, plans, or attempt; a previous suicide attempt by you or a family member -thyroid disease -an unusual or allergic reaction to methylphenidate, other medicines, foods, dyes, or preservatives -pregnant or trying to get pregnant -breast-feeding How should I use this medicine? Take this medicine by mouth with a glass of water. Follow the directions on the prescription label. It is best to take this medicine 30 to 45 minutes before meals, unless your doctor tells you otherwise. Take your medicine at regular intervals. Usually the last dose of the day will be taken at least 4 to 6 hours before bedtime, so it will not interfere with sleep. Do not take your medicine more often than directed. A special MedGuide will be given to you by the pharmacist with each prescription and refill. Be sure to read this information carefully each time. Talk to your pediatrician regarding the use of this medicine in children. While this drug may be prescribed for children as young as 21 years of age for selected conditions, precautions do apply. Overdosage: If you think you have taken too much of this medicine contact a poison control center or emergency room at once. NOTE: This medicine is only for you. Do not share this medicine with others. What if I miss a dose? If you miss a dose, take it as soon as you can. If it is almost time for your next dose, take only that dose. Do not take double or extra doses. What may interact with this medicine? Do not take this medicine with any of the following medications: -lithium -MAOIs like Carbex, Eldepryl, Marplan, Nardil, and Parnate -other stimulant medicines for attention disorders, weight loss, or to stay awake -procarbazine This medicine may also interact with the following medications: -atomoxetine -caffeine -certain  medicines for blood pressure, heart disease, irregular heart beat -certain medicines for depression, anxiety, or psychotic disturbances -certain medicines for seizures like carbamazepine, phenobarbital, phenytoin -cold or allergy medicines -warfarin This list may not describe all possible interactions. Give your health care provider a list of all the medicines, herbs, non-prescription drugs, or dietary supplements you use. Also tell them if you smoke, drink alcohol, or use illegal drugs. Some items may interact with your medicine. What should I watch for while using this medicine? Visit your doctor or health care professional for regular checks on your progress. This prescription requires that you follow special procedures with your doctor and pharmacy. You will need to have a new written prescription from your doctor or health care professional every time you need a refill. This medicine may affect your concentration, or hide signs of tiredness. Until you know how this drug affects you, do not drive, ride a bicycle, use machinery, or do anything that needs mental alertness. Tell your doctor or health care professional if this medicine loses its effects, or if you feel you need to take more than the prescribed amount. Do not change the dosage without talking to your doctor or health care professional. For males, contact your doctor or health care professional right away if you have an erection that lasts longer than 4 hours or if it becomes painful.  This may be a sign of a serious problem and must be treated right away to prevent permanent damage. Decreased appetite is a common side effect when starting this medicine. Eating small, frequent meals or snacks can help. Talk to your doctor if you continue to have poor eating habits. Height and weight growth of a child taking this medicine will be monitored closely. Do not take this medicine close to bedtime. It may prevent you from sleeping. If you are  going to need surgery, a MRI, CT scan, or other procedure, tell your doctor that you are taking this medicine. You may need to stop taking this medicine before the procedure. Tell your doctor or healthcare professional right away if you notice unexplained wounds on your fingers and toes while taking this medicine. You should also tell your healthcare provider if you experience numbness or pain, changes in the skin color, or sensitivity to temperature in your fingers or toes. What side effects may I notice from receiving this medicine? Side effects that you should report to your doctor or health care professional as soon as possible: -allergic reactions like skin rash, itching or hives, swelling of the face, lips, or tongue -changes in vision -chest pain or chest tightness -fast, irregular heartbeat -fingers or toes feel numb, cool, painful -hallucination, loss of contact with reality -high blood pressure -males: prolonged or painful erection -seizures -severe headaches -shortness of breath -suicidal thoughts or other mood changes -trouble walking, dizziness, loss of balance or coordination -uncontrollable head, mouth, neck, arm, or leg movements -unusual bleeding or bruising Side effects that usually do not require medical attention (report to your doctor or health care professional if they continue or are bothersome): -anxious -headache -loss of appetite -nausea, vomiting -trouble sleeping -weight loss This list may not describe all possible side effects. Call your doctor for medical advice about side effects. You may report side effects to FDA at 1-800-FDA-1088. Where should I keep my medicine? Keep out of the reach of children. This medicine can be abused. Keep your medicine in a safe place to protect it from theft. Do not share this medicine with anyone. Selling or giving away this medicine is dangerous and against the law. This medicine may cause accidental overdose and death if  taken by other adults, children, or pets. Mix any unused medicine with a substance like cat litter or coffee grounds. Then throw the medicine away in a sealed container like a sealed bag or a coffee can with a lid. Do not use the medicine after the expiration date. Store at room temperature between 15 and 30 degrees C (59 and 86 degrees F). Protect from light and moisture. Keep container tightly closed. NOTE: This sheet is a summary. It may not cover all possible information. If you have questions about this medicine, talk to your doctor, pharmacist, or health care provider.    2016, Elsevier/Gold Standard. (2014-09-03 15:33:34)     IF you received an x-ray today, you will receive an invoice from [facility]. Please contact [facility] at [phone] with questions or concerns regarding your invoice.   IF you received labwork today, you will receive an invoice from [employer]. Please contact [facility] at [phone] with questions or concerns regarding your invoice.   Our billing staff will not be able to assist you with questions regarding bills from these companies.  You will be contacted with the lab results as soon as they are available. The fastest way to get your results is  to activate your My Chart account. Instructions are located on the last page of this paperwork. If you have not heard from us regarding the results in 2 weeks, please contact this office.      

## 2016-07-28 NOTE — Progress Notes (Signed)
    MRN: [id number] DOB: 11-Jun-1991  Subjective:   Edward Dunn is a 25 y.o. male presenting for follow up on ADHD. Managed with Ritalin, has been on this since he was a teenager. Was previously seeing PA-Nicole. Does well with the medication. Denies irritability, chest pain, heart racing, decreased appetite, insomnia. Smokes 1 cigarette per day, has been cutting back and plans on quitting entirely soon. Denies drinking alcohol.  Edward Dunn has a current medication list which includes the following prescription(s): methylphenidate, methylphenidate, and methylphenidate. Also has No Known Allergies.  Edward Dunn  has a past medical history of ADHD (attention deficit hyperactivity disorder); Migraine; and Syphilis. Also  has no past surgical history on file.  Objective:   Vitals: BP 122/72 (BP Location: Right Arm, Patient Position: Sitting, Cuff Size: Normal)   Pulse 61   Temp 97.9 F (36.6 C) (Oral)   Resp 17   Ht 5\' 10"  (1.778 m)   Wt 160 lb (72.6 kg)   SpO2 100%   BMI 22.96 kg/m   Physical Exam  Constitutional: He is oriented to person, place, and time. He appears well-developed and well-nourished.  Cardiovascular: Normal rate, regular rhythm and intact distal pulses.  Exam reveals no gallop and no friction rub.   No murmur heard. Pulmonary/Chest: No respiratory distress. He has no wheezes. He has no rales.  Neurological: He is alert and oriented to person, place, and time.   Assessment and Plan :   1. Attention deficit hyperactivity disorder (ADHD), combined type - Stable, counseled patient on our controlled substance policy. He has been compliant as seen on medication review and [facility]. He denies taking any hydrocodone at the moment. Patient will call in 3 months for an additional 3 month refill. This is okay if the request is sent to me, follow up in 6 months.  Edward Bamberg, PA-C [facility] [phone] 07/28/2016 10:33 AM

## 2016-10-18 ENCOUNTER — Telehealth: Payer: Self-pay | Admitting: Family Medicine

## 2016-10-18 DIAGNOSIS — F902 Attention-deficit hyperactivity disorder, combined type: Secondary | ICD-10-CM

## 2016-10-18 MED ORDER — METHYLPHENIDATE HCL 10 MG PO TABS: 10.0000 mg | ORAL_TABLET | Freq: Two times a day (BID) | ORAL | 0 refills | Status: DC

## 2016-10-18 NOTE — Telephone Encounter (Signed)
Patient needing med refill on Ritalin please respond

## 2016-10-18 NOTE — Telephone Encounter (Signed)
Last OV on 8/2 and 3 RFs were written then with note that pt could call for 3 more. Pended w/same fill date instr's, but not sure if you want to put specific dates since they are not quite due yet? Pharm rules will probably not let them be filled early anyway.

## 2016-10-18 NOTE — Telephone Encounter (Signed)
These would be early refills. With his last OV being in 07/28/2016, he should have enough to last until 10/28/2016. I will write for scripts in this way. If he states that he needs this in a different way, he needs to rtc for a recheck with me specifically.

## 2016-10-19 NOTE — Telephone Encounter (Signed)
Rx. Up front for pick up 

## 2016-10-26 NOTE — Telephone Encounter (Signed)
Pt states he now gets green pills and he use to get white pills. When he takes the green pills he feels the need to take 3 pills for the same effect. He would like to get the white pills again. CB [phone]

## 2016-10-27 NOTE — Telephone Encounter (Signed)
LMOM for pt advising him to contact is pharm to see if they can get more of the ritalin from the manufacturer of the white pills that works better for him, and then he could also contact other pharm to see if they can order from that manufacturer. If this doesn't work, advised pt to RTC to discuss w/provider for either dosage change, or name brand only.

## 2016-11-08 ENCOUNTER — Ambulatory Visit: Payer: PRIVATE HEALTH INSURANCE

## 2017-01-05 ENCOUNTER — Encounter (HOSPITAL_BASED_OUTPATIENT_CLINIC_OR_DEPARTMENT_OTHER): Payer: Self-pay | Admitting: *Deleted

## 2017-01-05 ENCOUNTER — Emergency Department (HOSPITAL_BASED_OUTPATIENT_CLINIC_OR_DEPARTMENT_OTHER)
Admission: EM | Admit: 2017-01-05 | Discharge: 2017-01-05 | Disposition: A | Discharge status: Home / Self Care | Payer: No Typology Code available for payment source | Attending: Emergency Medicine | Admitting: Emergency Medicine

## 2017-01-05 DIAGNOSIS — Z87891 Personal history of nicotine dependence: Secondary | ICD-10-CM | POA: Diagnosis not present

## 2017-01-05 DIAGNOSIS — F909 Attention-deficit hyperactivity disorder, unspecified type: Secondary | ICD-10-CM | POA: Diagnosis not present

## 2017-01-05 DIAGNOSIS — Z8619 Personal history of other infectious and parasitic diseases: Secondary | ICD-10-CM | POA: Diagnosis not present

## 2017-01-05 DIAGNOSIS — Z202 Contact with and (suspected) exposure to infections with a predominantly sexual mode of transmission: Secondary | ICD-10-CM | POA: Diagnosis present

## 2017-01-05 MED ORDER — DOXYCYCLINE HYCLATE 100 MG PO CAPS: ORAL_CAPSULE | ORAL | 0 refills | Status: DC

## 2017-01-05 NOTE — ED Provider Notes (Signed)
[facility] Provider Note   CSN: [id number] Arrival date & time: 01/05/17  1050     History   Chief Complaint Chief Complaint  Patient presents with  . Exposure to STD    HPI Edward Dunn is a 26 y.o. male.  Pt presents to the ED today with a + syphilis test.  The pt has a hx of syphilis about 8 years ago.  He was treated with penicillin.  The pt went to urgent care mid-December and was told that he had a positive treponemal test.  The RPR was 1:4.  Pt did not know if that meant if he had syphilis again or not.  He has no symptoms.  He is also interested in post-exposure STI prophylaxis.        Past Medical History:  Diagnosis Date  . ADHD (attention deficit hyperactivity disorder)   . Migraine   . Syphilis     Patient Active Problem List   Diagnosis Date Noted  . History of syphilis 01/22/2015  . ADHD (attention deficit hyperactivity disorder) 08/20/2014    History reviewed. No pertinent surgical history.     Home Medications    Prior to Admission medications   Medication Sig Start Date End Date Taking? Authorizing Provider  doxycycline (VIBRAMYCIN) 100 MG capsule Take 2, 100 mg pills within 72 hours of unprotected sex. 01/05/17   Jacalyn LefevreJulie Loribeth Katich, MD  methylphenidate (RITALIN) 10 MG tablet Take 1 tablet (10 mg total) by mouth 2 (two) times daily. 10/18/16 02/13/18  Wallis BambergMario Mani, PA-C  methylphenidate (RITALIN) 10 MG tablet Take 1 tablet (10 mg total) by mouth 2 (two) times daily. 10/18/16   Wallis BambergMario Mani, PA-C  methylphenidate (RITALIN) 10 MG tablet Take 1 tablet (10 mg total) by mouth 2 (two) times daily. 10/18/16   Wallis BambergMario Mani, PA-C    Family History History reviewed. No pertinent family history.  Social History Social History  Substance Use Topics  . Smoking status: Former Smoker    Packs/day: 0.25    Years: 1.00    Types: Cigarettes  . Smokeless tobacco: Never Used  . Alcohol use Yes     Comment: social     Allergies   Patient has no known  allergies.   Review of Systems Review of Systems  All other systems reviewed and are negative.    Physical Exam Updated Vital Signs BP (!) 90/52   Pulse (!) 56   Temp 98.2 F (36.8 C) (Oral)   Resp 18   Ht 5\' 11"  (1.803 m)   Wt 145 lb (65.8 kg)   SpO2 100%   BMI 20.22 kg/m   Physical Exam  Constitutional: He appears well-developed and well-nourished.  HENT:  Head: Normocephalic and atraumatic.  Right Ear: External ear normal.  Left Ear: External ear normal.  Nose: Nose normal.  Mouth/Throat: Oropharynx is clear and moist.  Eyes: Conjunctivae and EOM are normal. Pupils are equal, round, and reactive to light.  Neck: Normal range of motion. Neck supple.  Cardiovascular: Normal rate, regular rhythm, normal heart sounds and intact distal pulses.   Pulmonary/Chest: Effort normal and breath sounds normal.  Abdominal: Soft. Bowel sounds are normal.  Skin: Skin is warm. No rash noted.  Psychiatric: He has a normal mood and affect. His behavior is normal. Judgment and thought content normal.  Nursing note and vitals reviewed.    ED Treatments / Results  Labs (all labs ordered are listed, but only abnormal results are displayed) Labs Reviewed - No data  to display  EKG  EKG Interpretation None       Radiology No results found.  Procedures Procedures (including critical care time)  Medications Ordered in ED Medications - No data to display   Initial Impression / Assessment and Plan / ED Course  I have reviewed the triage vital signs and the nursing notes.  Pertinent labs & imaging results that were available during my care of the patient were reviewed by me and considered in my medical decision making (see chart for details).  Clinical Course    Pt will always have a positive treponemal test.  Last RPR ratio in 2016 was 1:2, so there is improvement.  I referred him to the ID clinic as they have the expertise to follow his syphilis history.  He is encouraged  to use condoms when having sex, but he is given rx for doxy to take post exposure.  He knows they have no effect on HIV, syphilis, and gonorrhea.   He knows to return for any concerns.  Final Clinical Impressions(s) / ED Diagnoses   Final diagnoses:  History of syphilis    New Prescriptions New Prescriptions   DOXYCYCLINE (VIBRAMYCIN) 100 MG CAPSULE    Take 2, 100 mg pills within 72 hours of unprotected sex.     Jacalyn Lefevre, MD 01/05/17 [phone]

## 2017-01-05 NOTE — ED Notes (Signed)
ED Provider at bedside. 

## 2017-01-05 NOTE — ED Triage Notes (Signed)
Pt states [facility] called with positive syphilis , here for tx

## 2017-02-15 ENCOUNTER — Emergency Department (HOSPITAL_BASED_OUTPATIENT_CLINIC_OR_DEPARTMENT_OTHER)
Admission: EM | Admit: 2017-02-15 | Discharge: 2017-02-15 | Disposition: A | Discharge status: Home / Self Care | Payer: Managed Care, Other (non HMO) | Attending: Emergency Medicine | Admitting: Emergency Medicine

## 2017-02-15 ENCOUNTER — Encounter (HOSPITAL_BASED_OUTPATIENT_CLINIC_OR_DEPARTMENT_OTHER): Payer: Self-pay

## 2017-02-15 DIAGNOSIS — F17210 Nicotine dependence, cigarettes, uncomplicated: Secondary | ICD-10-CM | POA: Insufficient documentation

## 2017-02-15 DIAGNOSIS — Z79899 Other long term (current) drug therapy: Secondary | ICD-10-CM | POA: Insufficient documentation

## 2017-02-15 DIAGNOSIS — F909 Attention-deficit hyperactivity disorder, unspecified type: Secondary | ICD-10-CM | POA: Insufficient documentation

## 2017-02-15 DIAGNOSIS — Z113 Encounter for screening for infections with a predominantly sexual mode of transmission: Secondary | ICD-10-CM

## 2017-02-15 LAB — URINALYSIS, ROUTINE W REFLEX MICROSCOPIC
Bilirubin Urine: NEGATIVE
Glucose, UA: NEGATIVE mg/dL
HGB URINE DIPSTICK: NEGATIVE
Ketones, ur: NEGATIVE mg/dL
Leukocytes, UA: NEGATIVE
NITRITE: NEGATIVE
PH: 5.5 (ref 5.0–8.0)
Protein, ur: NEGATIVE mg/dL
SPECIFIC GRAVITY, URINE: 1.031 — AB (ref 1.005–1.030)

## 2017-02-15 NOTE — ED Provider Notes (Signed)
[facility] Provider Note   CSN: [id number] Arrival date & time: 02/15/17  1618  By signing my name below, I, Freida Busman, attest that this documentation has been prepared under the direction and in the presence of Arthor Captain, PA-C . Electronically Signed: Freida Busman, Scribe. 02/15/2017. 7:47 PM.   History   Chief Complaint Chief Complaint  Patient presents with  . Exposure to STD    The history is provided by the patient. No language interpreter was used.    HPI Comments:  Edward Dunn is a 26 y.o. male who presents to the Emergency Department for STD check. He states he had sex with a male last night and just wants to get checked out. He is asymptomatic at this time. He has sex with both males and females. Pt states he uses protection the majority of the time. Pt has no other complaints or associated symptoms at this time.    Past Medical History:  Diagnosis Date  . ADHD (attention deficit hyperactivity disorder)   . Migraine   . Syphilis     Patient Active Problem List   Diagnosis Date Noted  . History of syphilis 01/22/2015  . ADHD (attention deficit hyperactivity disorder) 08/20/2014    History reviewed. No pertinent surgical history.     Home Medications    Prior to Admission medications   Medication Sig Start Date End Date Taking? Authorizing Provider  methylphenidate (RITALIN) 10 MG tablet Take 1 tablet (10 mg total) by mouth 2 (two) times daily. 10/18/16 02/13/18  [name], PA-C  methylphenidate (RITALIN) 10 MG tablet Take 1 tablet (10 mg total) by mouth 2 (two) times daily. 10/18/16   [name], PA-C  methylphenidate (RITALIN) 10 MG tablet Take 1 tablet (10 mg total) by mouth 2 (two) times daily. 10/18/16   [name], PA-C    Family History No family history on file.  Social History Social History  Substance Use Topics  . Smoking status: Current Every Day Smoker    Packs/day: 0.25    Years: 1.00    Types: Cigarettes  .  Smokeless tobacco: Never Used  . Alcohol use Yes     Comment: occ     Allergies   Patient has no known allergies.   Review of Systems Review of Systems  Constitutional: Negative for chills and fever.       +STD check  Respiratory: Negative for shortness of breath.   Cardiovascular: Negative for chest pain.  Genitourinary: Negative for discharge, dysuria, flank pain, hematuria, penile pain and testicular pain.     Physical Exam Updated Vital Signs BP 119/74 (BP Location: Left Arm)   Pulse 70   Temp 98.5 F (36.9 C) (Oral)   Resp 22   Ht 5\' 11"  (1.803 m)   Wt 65.8 kg   SpO2 100%   BMI 20.22 kg/m   Physical Exam  Constitutional: He is oriented to person, place, and time. He appears well-developed and well-nourished. No distress.  HENT:  Head: Normocephalic and atraumatic.  Eyes: Conjunctivae are normal.  Cardiovascular: Normal rate.   Pulmonary/Chest: Effort normal.  Abdominal: He exhibits no distension.  Genitourinary: Penis normal. Circumcised.  Genitourinary Comments: No lesions   Chaperone (scribe) was present for exam which was performed with no discomfort or complications.   Musculoskeletal: Normal range of motion.  Neurological: He is alert and oriented to person, place, and time.  Skin: Skin is warm and dry.  Psychiatric: He has a normal mood and affect.  Nursing note and vitals reviewed.    ED Treatments / Results  DIAGNOSTIC STUDIES:  Oxygen Saturation is 100% on RA, normal by my interpretation.    COORDINATION OF CARE:  7:14 PM Discussed treatment plan with pt at bedside and pt agreed to plan.  Labs (all labs ordered are listed, but only abnormal results are displayed) Labs Reviewed  URINALYSIS, ROUTINE W REFLEX MICROSCOPIC - Abnormal; Notable for the following:       Result Value   Color, Urine AMBER (*)    Specific Gravity, Urine 1.031 (*)    All other components within normal limits  RPR  HIV ANTIBODY (ROUTINE TESTING)  GC/CHLAMYDIA  PROBE AMP ([place]) NOT AT [facility]    EKG  EKG Interpretation None       Radiology No results found.  Procedures Procedures (including critical care time)  Medications Ordered in ED Medications - No data to display   Initial Impression / Assessment and Plan / ED Course  I have reviewed the triage vital signs and the nursing notes.  Pertinent labs & imaging results that were available during my care of the patient were reviewed by me and considered in my medical decision making (see chart for details).     Sexual male presenting for STD screening. He is in a new sexual relationship. Patient has no symptoms at this time. He denies any known exposures to STD. Patient given referral to infectious disease for prep therapy. I discussed risks associated with high risk sexual demographics. The patient also given information for free STD testing. He appears safe for discharge at this time. Final Clinical Impressions(s) / ED Diagnoses   Final diagnoses:  Screen for STD (sexually transmitted disease)    New Prescriptions New Prescriptions   No medications on file   I personally performed the services described in this documentation, which was scribed in my presence. The recorded information has been reviewed and is accurate.        Arthor Captainbigail Brenda Samano, PA-C 02/15/17 1947    Linwood DibblesJon Knapp, MD 02/17/17 2055

## 2017-02-15 NOTE — Discharge Instructions (Signed)
Free HIV and STD Testing °These locations offer FREE confidential testing for HIV, Chlamydia, Gonorrhea, and Syphilis. °Non-Traditional Testing Sites Address Telephone ° °[facility] [address], °[place] °[phone] °Mondays 5pm - 7pm ° °[facility] °[address], [address] °[place] °[phone] °Wednesdays 2pm-8pm ° °[facility] °[address], °[place] °[phone] °Thursdays 9am-12noon °1pm-4pm ° °[facility] °[address], [place] °[phone] °Tuesdays °Thursdays °9am-12noon °1pm-4pm ° °[facility] offers free, confidential testing and treatment for HIV, Chlamydia, Gonorrhea, Syphilis, Herpes, Bacterial Vaginosis, Yeast, and Trichomoniasis. °Traditional Testing ° ° °[facility] °[address], °[place] °[phone]  °Monday thru Friday  °Call for an appointment ° °[facility] [place] [facility] °[address] °[place] °[phone] °Monday thru Friday  °Call for anappointment. ° °If you have any questions about this information please call [phone]. °11/04/2011 ° °

## 2017-02-15 NOTE — ED Triage Notes (Signed)
C/o possible STD exposure-dysuria x today-NAD-steady gait

## 2017-02-23 ENCOUNTER — Telehealth: Payer: Self-pay | Admitting: Emergency Medicine

## 2017-02-23 ENCOUNTER — Telehealth: Payer: Self-pay

## 2017-02-23 ENCOUNTER — Ambulatory Visit (INDEPENDENT_AMBULATORY_CARE_PROVIDER_SITE_OTHER): Payer: PRIVATE HEALTH INSURANCE | Admitting: Emergency Medicine

## 2017-02-23 VITALS — BP 122/70 | HR 80 | Temp 98.8°F | Resp 18 | Ht 71.0 in | Wt 146.0 lb

## 2017-02-23 DIAGNOSIS — F902 Attention-deficit hyperactivity disorder, combined type: Secondary | ICD-10-CM

## 2017-02-23 DIAGNOSIS — N521 Erectile dysfunction due to diseases classified elsewhere: Secondary | ICD-10-CM

## 2017-02-23 MED ORDER — SILDENAFIL CITRATE 100 MG PO TABS: 100.0000 mg | ORAL_TABLET | Freq: Every day | ORAL | 5 refills | Status: AC | PRN

## 2017-02-23 MED ORDER — METHYLPHENIDATE HCL 10 MG PO TABS: 10.0000 mg | ORAL_TABLET | Freq: Every day | ORAL | 0 refills | Status: DC

## 2017-02-23 NOTE — Progress Notes (Signed)
Edward Dunn 26 y.o.   Chief Complaint  Patient presents with  . ADDERALL REFILL  . GROIN ISSUES    CAN'T GET ERECTION    HISTORY OF PRESENT ILLNESS: This is a 26 y.o. male needs Ritalin refills and also c/o erectile difficulty/dysfunction.  HPI   Prior to Admission medications   Medication Sig Start Date End Date Taking? Authorizing Provider  amphetamine-dextroamphetamine (ADDERALL) 10 MG tablet Take 10 mg by mouth daily with breakfast.   Yes Historical Provider, MD  methylphenidate (RITALIN) 10 MG tablet Take 1 tablet (10 mg total) by mouth daily before breakfast. 02/23/17 03/25/17  Georgina Quint, MD  methylphenidate (RITALIN) 10 MG tablet Take 1 tablet (10 mg total) by mouth daily before breakfast. 03/26/17 04/25/17  Georgina Quint, MD  methylphenidate (RITALIN) 10 MG tablet Take 1 tablet (10 mg total) by mouth daily before breakfast. 04/26/17 05/26/17  Georgina Quint, MD  sildenafil (VIAGRA) 100 MG tablet Take 1 tablet (100 mg total) by mouth daily as needed for erectile dysfunction. 02/23/17   Georgina Quint, MD    No Known Allergies  Patient Active Problem List   Diagnosis Date Noted  . History of syphilis 01/22/2015  . ADHD (attention deficit hyperactivity disorder) 08/20/2014    Past Medical History:  Diagnosis Date  . ADHD (attention deficit hyperactivity disorder)   . Migraine   . Syphilis     History reviewed. No pertinent surgical history.  Social History   Social History  . Marital status: Single    Spouse name: N/A  . Number of children: N/A  . Years of education: N/A   Occupational History  . Not on file.   Social History Main Topics  . Smoking status: Current Every Day Smoker    Packs/day: 0.25    Years: 1.00    Types: Cigarettes  . Smokeless tobacco: Never Used  . Alcohol use Yes     Comment: occ  . Drug use: No  . Sexual activity: Yes   Other Topics Concern  . Not on file   Social History Narrative  . No narrative  on file    History reviewed. No pertinent family history.   Review of Systems  Constitutional: Negative for chills and fever.  HENT: Negative for congestion and sore throat.   Eyes: Negative for blurred vision and double vision.  Respiratory: Negative for cough and shortness of breath.   Cardiovascular: Negative for chest pain and palpitations.  Gastrointestinal: Negative for abdominal pain, diarrhea, nausea and vomiting.  Genitourinary: Negative for dysuria, flank pain, frequency, hematuria and urgency.       Erectile dysfunction  Musculoskeletal: Negative for myalgias and neck pain.  Skin: Negative for rash.  Neurological: Negative for dizziness and headaches.  Endo/Heme/Allergies: Does not bruise/bleed easily.  Psychiatric/Behavioral: Negative for depression. The patient is not nervous/anxious.   All other systems reviewed and are negative.  Vitals:   02/23/17 1557  BP: 122/70  Pulse: 80  Resp: 18  Temp: 98.8 F (37.1 C)     Physical Exam  Constitutional: He is oriented to person, place, and time. He appears well-developed and well-nourished.  HENT:  Head: Normocephalic and atraumatic.  Nose: Nose normal.  Mouth/Throat: Oropharynx is clear and moist. No oropharyngeal exudate.  Eyes: Conjunctivae and EOM are normal. Pupils are equal, round, and reactive to light.  Neck: Normal range of motion. Neck supple. No JVD present. No thyromegaly present.  Cardiovascular: Normal rate, regular rhythm and normal heart sounds.  Pulmonary/Chest: Effort normal and breath sounds normal.  Abdominal: Soft. He exhibits no distension. There is no tenderness.  Musculoskeletal: Normal range of motion.  Lymphadenopathy:    He has no cervical adenopathy.  Neurological: He is alert and oriented to person, place, and time.  Skin: Skin is warm and dry. Capillary refill takes less than 2 seconds.  Psychiatric: He has a normal mood and affect. His behavior is normal.  Vitals  reviewed.    ASSESSMENT & PLAN: Edward Dunn was seen today for adderall refill and groin issues.  Diagnoses and all orders for this visit:  Attention deficit hyperactivity disorder (ADHD), combined type  Erectile dysfunction due to diseases classified elsewhere  Other orders -     methylphenidate (RITALIN) 10 MG tablet; Take 1 tablet (10 mg total) by mouth daily before breakfast. -     methylphenidate (RITALIN) 10 MG tablet; Take 1 tablet (10 mg total) by mouth daily before breakfast. -     methylphenidate (RITALIN) 10 MG tablet; Take 1 tablet (10 mg total) by mouth daily before breakfast. -     sildenafil (VIAGRA) 100 MG tablet; Take 1 tablet (100 mg total) by mouth daily as needed for erectile dysfunction.    Patient Instructions       IF you received an x-ray today, you will receive an invoice from [facility]. Please contact [facility] at [phone] with questions or concerns regarding your invoice.   IF you received labwork today, you will receive an invoice from [facility]. Please contact [facility] at [phone] with questions or concerns regarding your invoice.   Our billing staff will not be able to assist you with questions regarding bills from these companies.  You will be contacted with the lab results as soon as they are available. The fastest way to get your results is to activate your My Chart account. Instructions are located on the last page of this paperwork. If you have not heard from us regarding the results in 2 weeks, please contact this office.     Attention Deficit Hyperactivity Disorder, Pediatric Attention deficit hyperactivity disorder (ADHD) is a condition that can make it hard for a child to pay attention and concentrate or to control his or her behavior. The child may also have a lot of energy. ADHD is a disorder of the brain (neurodevelopmental disorder), and symptoms are typically first seen in early childhood. It is a common reason  for behavioral and academic problems in school. There are three main types of ADHD:  Inattentive. With this type, children have difficulty paying attention.  Hyperactive-impulsive. With this type, children have a lot of energy and have difficulty controlling their behavior.  Combination. This type involves having symptoms of both of the other types. ADHD is a lifelong condition. If it is not treated, the disorder can affect a child's future academic achievement, employment, and relationships. What are the causes? The exact cause of this condition is not known. What increases the risk? This condition is more likely to develop in:  Children who have a first-degree relative, such as a parent or brother or sister, with the condition.  Children who had a low birth weight.  Children whose mothers had problems during pregnancy or used alcohol or tobacco during pregnancy.  Children who have had a brain infection or a head injury.  Children who have been exposed to lead. What are the signs or symptoms? Symptoms of this condition depend on the type of ADHD. Symptoms are listed here for each  type: Inattentive   Problems with organization.  Difficulty staying focused.  Problems completing assignments at school.  Often making simple mistakes.  Problems sustaining mental effort.  Not listening to instructions.  Losing things often.  Forgetting things often.  Being easily distracted. Hyperactive-impulsive   Fidgeting often.  Difficulty sitting still in one's seat.  Talking a lot.  Talking out of turn.  Interrupting others.  Difficulty relaxing or doing quiet activities.  High energy levels and constant movement.  Difficulty waiting.  Always "on the go." Combination   Having symptoms of both of the other types. Children with ADHD may feel frustrated with themselves and may find school to be particularly discouraging. They often perform below their abilities in  school. As children get older, the excess movement can lessen, but the problems with paying attention and staying organized often continue. Most children do not outgrow ADHD, but with good treatment, they can learn to cope with the symptoms. How is this diagnosed? This condition is diagnosed based on a child's symptoms and academic history. The child's health care provider will do a complete assessment. As part of the assessment, the health care provider will ask the child questions and will ask the parents and teachers for their observations of the child. The health care provider looks for specific symptoms of ADHD. Diagnosis will include:  Ruling out other reasons for the child's behavior.  Reviewing behavior rating scales that have been filled out about the child by people who deal with the child on a daily basis. A diagnosis is made only after all information from multiple people has been considered. How is this treated? Treatment for this condition may include:  Behavior therapy.  Medicines to decrease impulsivity and hyperactivity and to increase attention. Behavior therapy is preferred for children younger than 34 years old. The combination of medicine and behavior therapy is most effective for children older than 49 years of age.  Tutoring or extra support at school.  Techniques for parents to use at home to help manage their child's symptoms and behavior. Follow these instructions at home: Eating and drinking   Offer your child a well-balanced diet. Breakfast that includes a balance of whole grains, protein, and fruits or vegetables is especially important for school performance.  If your child has trouble with hyperactivity, have your child avoid drinks that contain caffeine. These include:  Soft drinks.  Coffee.  Tea.  If your child is older and finds that caffeinated drinks help to improve his or her attention, talk with your child's health care provider about what amount  of caffeine intake is a safe for your child. Lifestyle    Make sure your child gets a full night of sleep and regular daily exercise.  Help manage your child's behavior by following the techniques learned in therapy. These may include:  Looking for good behavior and rewarding it.  Making rules for behavior that your child can understand and follow.  Giving clear instructions.  Responding consistently to your child's challenging behaviors.  Setting realistic goals.  Looking for activities that can lead to success and self-esteem.  Making time for pleasant activities with your child.  Giving lots of affection.  Help your child learn to be organized. Some ways to do this include:  Keeping daily schedules the same. Have a regular wake-up time and bedtime for your child. Schedule all activities, including time for homework and time for play. Post the schedule in a place where your child will see it. Loraine Leriche  schedule changes in advance.  Having a regular place for your child to store items such as clothing, backpacks, and school supplies.  Encouraging your child to write down school assignments and to bring home needed books. Work with your child's teachers for assistance in organizing school work. General instructions   Learn as much as you can about ADHD. This will improve your ability to help your child and to make sure he or she gets the support needed. It will also help you educate your child's teachers and instructors if they do not feel that they have adequate knowledge or experience in these areas.  Work with your child's teachers to make sure your child gets the support and extra help that is needed. This may include:  Tutoring.  Teacher cues to help your child remain on task.  Seating changes so your child is working at a desk that is free from distractions.  Give over-the-counter and prescription medicines only as told by your child's health care provider.  Keep all  follow-up visits as told by your health care provider. This is important. Contact a health care provider if:  Your child has repeated muscle twitches (tics), coughs, or speech outbursts.  Your child has sleep problems.  Your child has a marked loss of appetite.  Your child develops depression.  Your child has new or worsening behavioral problems.  Your child has dizziness.  Your child has a racing heart.  Your child has stomach pains.  Your child develops headaches. Get help right away if:  Your child talks about or threatens suicide.  You are worried that your child is having a bad reaction to a medicine that he or she is taking for ADHD. This information is not intended to replace advice given to you by your health care provider. Make sure you discuss any questions you have with your health care provider. Document Released: 12/03/2002 Document Revised: 08/11/2016 Document Reviewed: 07/08/2016 Elsevier Interactive Patient Education  2017 [employer]     Edwina Barth, MD [facility]

## 2017-02-23 NOTE — Patient Instructions (Addendum)
   IF you received an x-ray today, you will receive an invoice from [facility]. Please contact [facility] at [phone] with questions or concerns regarding your invoice.   IF you received labwork today, you will receive an invoice from [facility]. Please contact [facility] at [phone] with questions or concerns regarding your invoice.   Our billing staff will not be able to assist you with questions regarding bills from these companies.  You will be contacted with the lab results as soon as they are available. The fastest way to get your results is to activate your My Chart account. Instructions are located on the last page of this paperwork. If you have not heard from us regarding the results in 2 weeks, please contact this office.      Attention Deficit Hyperactivity Disorder, Pediatric Attention deficit hyperactivity disorder (ADHD) is a condition that can make it hard for a child to pay attention and concentrate or to control his or her behavior. The child may also have a lot of energy. ADHD is a disorder of the brain (neurodevelopmental disorder), and symptoms are typically first seen in early childhood. It is a common reason for behavioral and academic problems in school. There are three main types of ADHD:  Inattentive. With this type, children have difficulty paying attention.  Hyperactive-impulsive. With this type, children have a lot of energy and have difficulty controlling their behavior.  Combination. This type involves having symptoms of both of the other types. ADHD is a lifelong condition. If it is not treated, the disorder can affect a child's future academic achievement, employment, and relationships. What are the causes? The exact cause of this condition is not known. What increases the risk? This condition is more likely to develop in:  Children who have a first-degree relative, such as a parent or brother or sister, with the  condition.  Children who had a low birth weight.  Children whose mothers had problems during pregnancy or used alcohol or tobacco during pregnancy.  Children who have had a brain infection or a head injury.  Children who have been exposed to lead. What are the signs or symptoms? Symptoms of this condition depend on the type of ADHD. Symptoms are listed here for each type: Inattentive   Problems with organization.  Difficulty staying focused.  Problems completing assignments at school.  Often making simple mistakes.  Problems sustaining mental effort.  Not listening to instructions.  Losing things often.  Forgetting things often.  Being easily distracted. Hyperactive-impulsive   Fidgeting often.  Difficulty sitting still in one's seat.  Talking a lot.  Talking out of turn.  Interrupting others.  Difficulty relaxing or doing quiet activities.  High energy levels and constant movement.  Difficulty waiting.  Always "on the go." Combination   Having symptoms of both of the other types. Children with ADHD may feel frustrated with themselves and may find school to be particularly discouraging. They often perform below their abilities in school. As children get older, the excess movement can lessen, but the problems with paying attention and staying organized often continue. Most children do not outgrow ADHD, but with good treatment, they can learn to cope with the symptoms. How is this diagnosed? This condition is diagnosed based on a child's symptoms and academic history. The child's health care provider will do a complete assessment. As part of the assessment, the health care provider will ask the child questions and will ask the parents and teachers for their observations of   the child. The health care provider looks for specific symptoms of ADHD. Diagnosis will include:  Ruling out other reasons for the child's behavior.  Reviewing behavior rating scales that  have been filled out about the child by people who deal with the child on a daily basis. A diagnosis is made only after all information from multiple people has been considered. How is this treated? Treatment for this condition may include:  Behavior therapy.  Medicines to decrease impulsivity and hyperactivity and to increase attention. Behavior therapy is preferred for children younger than 6 years old. The combination of medicine and behavior therapy is most effective for children older than 6 years of age.  Tutoring or extra support at school.  Techniques for parents to use at home to help manage their child's symptoms and behavior. Follow these instructions at home: Eating and drinking   Offer your child a well-balanced diet. Breakfast that includes a balance of whole grains, protein, and fruits or vegetables is especially important for school performance.  If your child has trouble with hyperactivity, have your child avoid drinks that contain caffeine. These include:  Soft drinks.  Coffee.  Tea.  If your child is older and finds that caffeinated drinks help to improve his or her attention, talk with your child's health care provider about what amount of caffeine intake is a safe for your child. Lifestyle    Make sure your child gets a full night of sleep and regular daily exercise.  Help manage your child's behavior by following the techniques learned in therapy. These may include:  Looking for good behavior and rewarding it.  Making rules for behavior that your child can understand and follow.  Giving clear instructions.  Responding consistently to your child's challenging behaviors.  Setting realistic goals.  Looking for activities that can lead to success and self-esteem.  Making time for pleasant activities with your child.  Giving lots of affection.  Help your child learn to be organized. Some ways to do this include:  Keeping daily schedules the same.  Have a regular wake-up time and bedtime for your child. Schedule all activities, including time for homework and time for play. Post the schedule in a place where your child will see it. Mark schedule changes in advance.  Having a regular place for your child to store items such as clothing, backpacks, and school supplies.  Encouraging your child to write down school assignments and to bring home needed books. Work with your child's teachers for assistance in organizing school work. General instructions   Learn as much as you can about ADHD. This will improve your ability to help your child and to make sure he or she gets the support needed. It will also help you educate your child's teachers and instructors if they do not feel that they have adequate knowledge or experience in these areas.  Work with your child's teachers to make sure your child gets the support and extra help that is needed. This may include:  Tutoring.  Teacher cues to help your child remain on task.  Seating changes so your child is working at a desk that is free from distractions.  Give over-the-counter and prescription medicines only as told by your child's health care provider.  Keep all follow-up visits as told by your health care provider. This is important. Contact a health care provider if:  Your child has repeated muscle twitches (tics), coughs, or speech outbursts.  Your child has sleep problems.  Your child   has a marked loss of appetite.  Your child develops depression.  Your child has new or worsening behavioral problems.  Your child has dizziness.  Your child has a racing heart.  Your child has stomach pains.  Your child develops headaches. Get help right away if:  Your child talks about or threatens suicide.  You are worried that your child is having a bad reaction to a medicine that he or she is taking for ADHD. This information is not intended to replace advice given to you by your  health care provider. Make sure you discuss any questions you have with your health care provider. Document Released: 12/03/2002 Document Revised: 08/11/2016 Document Reviewed: 07/08/2016 Elsevier Interactive Patient Education  2017 [employer]  

## 2017-02-23 NOTE — Telephone Encounter (Signed)
Pt is needing to talk with someone about getting something different from viagra the rx is $458.00   Best number [phone]

## 2017-02-24 ENCOUNTER — Emergency Department (HOSPITAL_BASED_OUTPATIENT_CLINIC_OR_DEPARTMENT_OTHER)
Admission: EM | Admit: 2017-02-24 | Discharge: 2017-02-24 | Disposition: A | Discharge status: Home / Self Care | Payer: Managed Care, Other (non HMO) | Attending: Emergency Medicine | Admitting: Emergency Medicine

## 2017-02-24 ENCOUNTER — Encounter (HOSPITAL_BASED_OUTPATIENT_CLINIC_OR_DEPARTMENT_OTHER): Payer: Self-pay | Admitting: Emergency Medicine

## 2017-02-24 DIAGNOSIS — F17210 Nicotine dependence, cigarettes, uncomplicated: Secondary | ICD-10-CM | POA: Insufficient documentation

## 2017-02-24 DIAGNOSIS — Z202 Contact with and (suspected) exposure to infections with a predominantly sexual mode of transmission: Secondary | ICD-10-CM

## 2017-02-24 DIAGNOSIS — Z79899 Other long term (current) drug therapy: Secondary | ICD-10-CM | POA: Insufficient documentation

## 2017-02-24 DIAGNOSIS — F909 Attention-deficit hyperactivity disorder, unspecified type: Secondary | ICD-10-CM | POA: Insufficient documentation

## 2017-02-24 MED ORDER — PENICILLIN G BENZATHINE 1200000 UNIT/2ML IM SUSP
2.4000 10*6.[IU] | Freq: Once | INTRAMUSCULAR | Status: AC
  Administered 2017-02-24: 2.4 10*6.[IU] via INTRAMUSCULAR
  Filled 2017-02-24: qty 4

## 2017-02-24 NOTE — Telephone Encounter (Signed)
L/m med not covered by insurance  May call [facility] or [facility] for prices and call back for new rx if desired

## 2017-02-24 NOTE — ED Triage Notes (Signed)
Pt got phone call today stating he tested positive for syphilis. Pt here for treatment. Denies symptoms.
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
## 2017-09-13 ENCOUNTER — Encounter (HOSPITAL_BASED_OUTPATIENT_CLINIC_OR_DEPARTMENT_OTHER): Payer: Self-pay | Admitting: *Deleted

## 2017-09-13 ENCOUNTER — Emergency Department (HOSPITAL_BASED_OUTPATIENT_CLINIC_OR_DEPARTMENT_OTHER)
Admission: EM | Admit: 2017-09-13 | Discharge: 2017-09-13 | Disposition: A | Discharge status: Home / Self Care | Payer: No Typology Code available for payment source | Attending: Emergency Medicine | Admitting: Emergency Medicine

## 2017-09-13 ENCOUNTER — Emergency Department (HOSPITAL_BASED_OUTPATIENT_CLINIC_OR_DEPARTMENT_OTHER): Payer: No Typology Code available for payment source

## 2017-09-13 DIAGNOSIS — Y999 Unspecified external cause status: Secondary | ICD-10-CM | POA: Diagnosis not present

## 2017-09-13 DIAGNOSIS — F17210 Nicotine dependence, cigarettes, uncomplicated: Secondary | ICD-10-CM | POA: Diagnosis not present

## 2017-09-13 DIAGNOSIS — Z79899 Other long term (current) drug therapy: Secondary | ICD-10-CM | POA: Diagnosis not present

## 2017-09-13 DIAGNOSIS — Y9389 Activity, other specified: Secondary | ICD-10-CM | POA: Diagnosis not present

## 2017-09-13 DIAGNOSIS — Y9289 Other specified places as the place of occurrence of the external cause: Secondary | ICD-10-CM | POA: Diagnosis not present

## 2017-09-13 DIAGNOSIS — S60922A Unspecified superficial injury of left hand, initial encounter: Secondary | ICD-10-CM | POA: Diagnosis present

## 2017-09-13 DIAGNOSIS — S60512A Abrasion of left hand, initial encounter: Secondary | ICD-10-CM | POA: Insufficient documentation

## 2017-09-13 MED ORDER — OXYCODONE-ACETAMINOPHEN 5-325 MG PO TABS: 1.0000 | ORAL_TABLET | Freq: Three times a day (TID) | ORAL | 0 refills | Status: AC | PRN
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
## 2017-09-13 NOTE — ED Notes (Signed)
EMT placed wet gauze on pt's hand and wrapped with dry gauze until PA can see the wound. PA notified.
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
## 2017-09-20 ENCOUNTER — Emergency Department (HOSPITAL_BASED_OUTPATIENT_CLINIC_OR_DEPARTMENT_OTHER)
Admission: EM | Admit: 2017-09-20 | Discharge: 2017-09-20 | Disposition: A | Discharge status: Home / Self Care | Payer: No Typology Code available for payment source | Attending: Emergency Medicine | Admitting: Emergency Medicine

## 2017-09-20 ENCOUNTER — Encounter (HOSPITAL_BASED_OUTPATIENT_CLINIC_OR_DEPARTMENT_OTHER): Payer: Self-pay | Admitting: Emergency Medicine

## 2017-09-20 DIAGNOSIS — F17210 Nicotine dependence, cigarettes, uncomplicated: Secondary | ICD-10-CM | POA: Diagnosis not present

## 2017-09-20 DIAGNOSIS — S60512D Abrasion of left hand, subsequent encounter: Secondary | ICD-10-CM | POA: Insufficient documentation

## 2017-09-20 DIAGNOSIS — Z5189 Encounter for other specified aftercare: Secondary | ICD-10-CM

## 2017-09-20 DIAGNOSIS — X58XXXA Exposure to other specified factors, initial encounter: Secondary | ICD-10-CM | POA: Diagnosis not present

## 2017-09-20 NOTE — ED Notes (Signed)
Pt given note for work.
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
## 2017-09-20 NOTE — Discharge Instructions (Signed)
Continue to keep the wound clean and dry. Follow up with your PCP as needed. Return to the ED with any sudden worsening pain, fever, chills, or drainage from the wound.
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
## 2017-09-20 NOTE — ED Notes (Signed)
Bandage applied to left hand 

## 2021-04-03 ENCOUNTER — Ambulatory Visit: Payer: Self-pay | Admitting: Family Medicine

## 2021-04-09 ENCOUNTER — Ambulatory Visit: Payer: Self-pay | Admitting: Internal Medicine

## 2021-06-23 ENCOUNTER — Emergency Department (HOSPITAL_COMMUNITY)
Admission: EM | Admit: 2021-06-23 | Discharge: 2021-06-23 | Disposition: A | Discharge status: Home / Self Care | Payer: No Typology Code available for payment source | Attending: Emergency Medicine | Admitting: Emergency Medicine

## 2021-06-23 ENCOUNTER — Encounter (HOSPITAL_COMMUNITY): Payer: Self-pay

## 2021-06-23 ENCOUNTER — Emergency Department (HOSPITAL_COMMUNITY): Payer: No Typology Code available for payment source

## 2021-06-23 DIAGNOSIS — F17210 Nicotine dependence, cigarettes, uncomplicated: Secondary | ICD-10-CM | POA: Insufficient documentation

## 2021-06-23 DIAGNOSIS — R200 Anesthesia of skin: Secondary | ICD-10-CM | POA: Diagnosis not present

## 2021-06-23 DIAGNOSIS — M545 Low back pain, unspecified: Secondary | ICD-10-CM | POA: Diagnosis not present

## 2021-06-23 DIAGNOSIS — Y92410 Unspecified street and highway as the place of occurrence of the external cause: Secondary | ICD-10-CM | POA: Diagnosis not present

## 2021-06-23 MED ORDER — KETOROLAC TROMETHAMINE 60 MG/2ML IM SOLN
60.0000 mg | Freq: Once | INTRAMUSCULAR | Status: AC
  Administered 2021-06-23: 17:00:00 60 mg via INTRAMUSCULAR
  Filled 2021-06-23: qty 2

## 2021-06-23 MED ORDER — METHOCARBAMOL 500 MG PO TABS: 500.0000 mg | ORAL_TABLET | Freq: Two times a day (BID) | ORAL | 0 refills | Status: AC

## 2021-06-23 NOTE — Discharge Instructions (Addendum)
Take NSAIDs or Tylenol as needed for the next week. Take this medicine with food. Take muscle relaxer at bedtime to help you sleep. This medicine makes you drowsy so do not take before driving or work Use a heating pad for sore muscles - use for 20 minutes several times a day Try gentle range of motion exercises Return for worsening symptoms  
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
## 2022-02-09 ENCOUNTER — Emergency Department (HOSPITAL_BASED_OUTPATIENT_CLINIC_OR_DEPARTMENT_OTHER)
Admission: EM | Admit: 2022-02-09 | Discharge: 2022-02-09 | Disposition: A | Discharge status: Home / Self Care | Payer: Self-pay | Attending: Emergency Medicine | Admitting: Emergency Medicine

## 2022-02-09 ENCOUNTER — Emergency Department (HOSPITAL_BASED_OUTPATIENT_CLINIC_OR_DEPARTMENT_OTHER): Payer: Self-pay

## 2022-02-09 ENCOUNTER — Encounter (HOSPITAL_BASED_OUTPATIENT_CLINIC_OR_DEPARTMENT_OTHER): Payer: Self-pay

## 2022-02-09 ENCOUNTER — Other Ambulatory Visit: Payer: Self-pay

## 2022-02-09 DIAGNOSIS — S060XAA Concussion with loss of consciousness status unknown, initial encounter: Secondary | ICD-10-CM | POA: Diagnosis not present

## 2022-02-09 DIAGNOSIS — Y92410 Unspecified street and highway as the place of occurrence of the external cause: Secondary | ICD-10-CM | POA: Diagnosis not present

## 2022-02-09 DIAGNOSIS — S161XXA Strain of muscle, fascia and tendon at neck level, initial encounter: Secondary | ICD-10-CM | POA: Diagnosis not present

## 2022-02-09 DIAGNOSIS — S0990XA Unspecified injury of head, initial encounter: Secondary | ICD-10-CM | POA: Diagnosis present
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
## 2022-02-09 NOTE — Discharge Instructions (Signed)
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
## 2022-02-09 NOTE — ED Notes (Signed)
ED Provider at bedside. 
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
## 2024-02-03 NOTE — ED Provider Notes (Signed)
 Patient placed in [facility] pathway, seen and evaluated for chief complaint of burning with urination, onset 2 days ago. Denies drainage, pain, swelling, or rashes. Pertinent exam findings include afebrile, VSS, NAD, unlabored respirations, speaking in complete sentences. Patient counseled on process, plan, and necessity for staying for completing the evaluation.   This document serves as a record of services personally performed by Geofm Best PA-C.  [facility] Emergency Department Provider Note  This document was created using the aid of voice recognition Dragon dictation software.   Provider at bedside: 02/03/2024 5:06 PM  History obtained from the: Patient  History   Chief Complaint  Patient presents with  . Exposure to STD    HPI  Edward Dunn is a 33 y.o. male who presents to the ED with complaints of dysuria and exposure to an STD.  He reports he had unprotected sex about 2 days ago.  He reports since then he has felt some burning on urination.  He denies any rashes or discharge coming from his penis.  He denies any fevers, chest pain, shortness of breath, abdominal pain, nausea, vomiting, diarrhea.  No LMP for male patient. ______________________ ROS: Pertinent positives and negatives per HPI.  Past Medical History No past medical history on file.  Past Surgical History No past surgical history on file.  Medications These were reviewed. See nursing note for details.  Allergies Patient has no known allergies.  Family History No family history on file.  Social History    All pertinent family history, social history and PMHx including medical, surgical, current medications and allergies were reviewed by myself. See nursing note for details.   Physical Exam  I have reviewed the following vital signs:  Vitals:   02/03/24 1537  BP: 120/76  BP Location: Right arm  Patient Position: Sitting  Pulse: 84  Resp: 16  Temp:  98.5 F (36.9 C)  TempSrc: Oral  SpO2: 97%  Weight: 70.3 kg (155 lb)  Height: 182.9 cm (6')    Physical Exam Constitutional:      General: He is not in acute distress.    Appearance: He is not ill-appearing, toxic-appearing or diaphoretic.  HENT:     Head: Normocephalic and atraumatic.     Nose: Nose normal. No congestion or rhinorrhea.     Mouth/Throat:     Mouth: Mucous membranes are moist.     Pharynx: Oropharynx is clear.  Eyes:     General: No scleral icterus. Cardiovascular:     Rate and Rhythm: Normal rate.     Heart sounds: No murmur heard.    No friction rub. No gallop.  Pulmonary:     Effort: Pulmonary effort is normal. No respiratory distress.     Breath sounds: No stridor. No wheezing, rhonchi or rales.  Chest:     Chest wall: No tenderness.  Abdominal:     General: Abdomen is flat. There is no distension.     Palpations: Abdomen is soft.     Tenderness: There is no abdominal tenderness.  Genitourinary:    Penis: Normal.      Testes: Normal.  Musculoskeletal:     Cervical back: Normal range of motion.  Skin:    General: Skin is warm and dry.     Capillary Refill: Capillary refill takes less than 2 seconds.  Neurological:     General: No focal deficit present.     Mental Status: He is alert and oriented to person, place,  and time.  Psychiatric:        Mood and Affect: Mood normal.        Behavior: Behavior normal.     Results  LABS Lab Results (last 24 hours)     Procedure Component Value Ref Range Date/Time   Urinalysis with Reflex to Microscopic [[id number]]  (Abnormal) Collected: 02/03/24 1638   Lab Status: Final result Specimen: Urine from Clean Catch Updated: 02/03/24 1704    Color, Urine Yellow Yellow     Clarity, Urine Clear Clear     Specific Gravity, Urine 1.034* 1.005 - 1.025     pH, Urine 6.0 5.0 - 8.0     Protein, Urine 30* Negative, 10 , 20  mg/dL     Glucose, Urine Negative Negative, 30 , 50  mg/dL     Ketones, Urine Negative  Negative, Trace mg/dL     Bilirubin, Urine Negative Negative     Blood, Urine Negative Negative, Trace     Nitrite, Urine Negative Negative     Leukocyte Esterase, Urine Negative Negative, 25     Urobilinogen, Urine 4.0* <2.0 mg/dL     WBC, Urine 0-5 <6 /HPF     RBC, Urine 0-2 0 - 2 /HPF     Bacteria, Urine Occasional* None Seen, Rare /HPF     Squamous Epithelial Cells, Urine 0-5 0 - 5 /HPF     Calcium Oxalate Crystals, Urine Present* None Seen /HPF    Chlamydia / Gonococcus (GC), NAAT [[id number]]  (Normal) Collected: 02/03/24 1638   Lab Status: Final result Specimen: Urine from Voided Updated: 02/03/24 1820    Chlamydia (CT) Not Detected Not Detected     Gonorrhea (GC) Not Detected Not Detected    Narrative:     The Xpert CT/NG Assay is an automated in vitro diagnostic test for qualitative detection and differentiation of DNA from CT and NG. The assay is performed on the [employer]. The Gene[employer]pert [employer] Systems automate and integrate sample purification, nucleic acid amplification, and detection of the target sequences in simple or complex samples using real-time PCR and RT-PCR assays.  Xpert CT/NG Assay performance has not been evaluated in patients less than fourteen years of age, in pregnant women, or in patients with a history of hysterectomy.       Labs reviewed by myself and considered in medical decision making. Please see below for more detail.  Radiology   Radiology Results (last 72 hours)     ** No results found for the last 72 hours. **       EKG  none  Procedure Note  Procedures  Medical Decision Making  Discussed options for workup with patient including risks and benefits via shared decision making and decided to proceed with workup.  ED Course ED Course as of 02/03/24 2012  Fri Feb 03, 2024  1720 Urinalysis with Reflex to Microscopic(!):   Color Yellow  Clarity, Urine Clear  Specific Gravity, Urine 1.034(!)  pH, Urine 6.0   Protein, Urine 30(!)  Glucose, Urine Negative  Ketones, Urine Negative  Bilirubin, Urine Negative  Blood, Urine Negative  Nitrite, Urine Negative  Leukocytes Esterase, Urine Negative  Urobilinogen, Urine 4.0(!)  WBC, Urine 0-5  RBC, Urine 0-2  Bacteria, Urine Occasional(!)  Squamous Epithelial Cells, Urine 0-5  Calcium Oxalate Crystals, Urine Present(!) Not clearly infected.  White blood cells and leukocyte esterase as well as nitrite and negative.  There are occasional bacteria.  Patient is being treated for gonorrhea and  chlamydia. [AO]    ED Course User Index [AO] Isaiah Carlyon Merles, PA-C    Initial Differential Diagnoses: I have considered the following in determining workup and treatments: Chlamydia, gonorrhea, syphilis, trichomonas, UTI, cystitis, pyelonephritis, epididymitis, orchitis, testicular torsion, kidney stone   Labs/Imaging/other testing ordered due to concerns discussed in history of and physical exam include: ua mic and gc/chlamydia   I considered ordering cbc and cmp but after history/exam/other workup, do not feel this is indicated at this time.  Medical Decision Making Problems Addressed: STD exposure: complicated acute illness or injury  Amount and/or Complexity of Data Reviewed Labs: ordered. Decision-making details documented in ED Course.  Risk Prescription drug management.    Labs Reviewed: See ED course  Imaging Reviewed: See ED course  Therapies: These medications and interventions were provided for the patient while in the ED. Medications  cefTRIAXone (ROCEPHIN) injection 500 mg (500 mg intramuscular Given 02/03/24 1719)    Discussed management or test interpretation with other qualified healthcare professional: The following consultations were required: none  Clinical Complexity  Number and complexity of problems addressed: Patient's presentation is most consistent with acute, uncomplicated illness..  Decision to admit or  increased level of care requiring transfer: No: Evaluation and diagnostic workup in ED does not suggest an emergent condition requiring admission or immediate observation beyond what has been performed at this time. The patient is safe for discharge and has been instructed to return for worsening symptoms, change in symptoms, or any other concerns.   Discussed options for workup with patient including risks and benefits via shared decision making and decided to proceed with workup.  Evidence based calculators if applicable:     Medical Record Review: I reviewed medical records available.   Provider time spent in patient care today, inclusive of but not limited to clinical reassessment, review of diagnostic studies, and discharge preparation, was less than 30 minutes.   ED Assessment and Plan   Pt is a 33 y.o. male who presented with dysuria and STD exposure.  On presentation patient is not acute events, afebrile not tachycardic not tachypneic.  He is satting well on room air.  Exam shows no abnormalities.  He has no lesions or rashes around his penis.  His urinalysis does show some bacteria but no obvious signs of infection such as leukocyte esterase or nitrites to an indicate a UTI.  I discussed prophylactic for gonorrhea chlamydia with the patient and he would like to proceed.  He received a shot of ceftriaxone in the ED for gonorrhea and I have prescribed doxycycline  for him to take if his chlamydia comes back positive.  His gonorrhea and chlamydia came back negative.  I will call him and let him know to not take the doxycycline . Pt advised to follow up with: PCP and ED if symptoms worsen or change History, physical exam, labs and imaging results are noted above. Patient in agreement with plan, all questions answered to satisfaction and patient is discharged in stable condition.   ED Clinical Impression   1. STD exposure     ED Disposition     ED Disposition  Discharge   Condition   Stable   Comment  --         No current Epic-ordered facility-administered medications on file.   Meds Ordered in [facility]  Medication Sig Dispense Refill  . doxycycline  (VIBRA -TABS) 100 mg tablet Take 1 tablet (100 mg total) by mouth 2 (two) times a day for 7 days. Take with 8  oz water. Do not lie down for at least 30 minutes after. 14 tablet 0     FOLLOW UP [facility] -  EMERGENCY DEPARTMENT [address] [employer] [place]  [address] [phone]  As needed, If symptoms worsen  ____________________________  *Some images could not be shown.

## 2024-11-07 ENCOUNTER — Other Ambulatory Visit (HOSPITAL_COMMUNITY): Payer: Self-pay

## 2024-11-07 ENCOUNTER — Encounter (HOSPITAL_COMMUNITY): Payer: Self-pay | Admitting: Pharmacist

## 2024-11-07 MED ORDER — NALOXONE HCL 4 MG/0.1ML NA LIQD
NASAL | 3 refills | Status: AC
  Filled 2024-11-07: qty 2, 1d supply, fill #0

## 2024-11-07 MED ORDER — FLUTICASONE-SALMETEROL 115-21 MCG/ACT IN AERO
2.0000 | INHALATION_SPRAY | Freq: Two times a day (BID) | RESPIRATORY_TRACT | 2 refills | Status: AC
  Filled 2024-11-07: qty 12, 30d supply, fill #0

## 2024-11-08 ENCOUNTER — Other Ambulatory Visit: Payer: Self-pay

## 2024-11-08 ENCOUNTER — Encounter: Payer: Self-pay | Admitting: Pharmacist

## 2024-11-08 ENCOUNTER — Other Ambulatory Visit (HOSPITAL_COMMUNITY): Payer: Self-pay

## 2024-12-18 ENCOUNTER — Encounter: Payer: Self-pay | Admitting: Nurse Practitioner

## 2024-12-18 ENCOUNTER — Telehealth: Payer: No Typology Code available for payment source | Admitting: Physician Assistant

## 2024-12-18 DIAGNOSIS — R310 Gross hematuria: Secondary | ICD-10-CM
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
## 2024-12-18 NOTE — Patient Instructions (Signed)
" °  Jerona Domino, thank you for joining Delon CHRISTELLA Dickinson, PA-C for today's virtual visit.  While this provider is not your primary care provider (PCP), if your PCP is located in our provider database this encounter information will be shared with them immediately following your visit.   [facility] MyChart account gives you access to today's visit and all your visits, tests, and labs performed at [facility]  click here if you don't have a [facility] MyChart account or go to [email or web address]  Consent: (Patient) Edward Dunn provided verbal consent for this virtual visit at the beginning of the encounter.  Current Medications:  Current Outpatient Medications:    amphetamine-dextroamphetamine (ADDERALL) 10 MG tablet, Take 10 mg by mouth daily with breakfast., Disp: , Rfl:    fluticasone -salmeterol (ADVAIR  HFA) 115-21 MCG/ACT inhaler, Inhale 2 puffs into the lungs 2 (two) times daily as needed for cough, wheeze or shortness of breath, Disp: 12 g, Rfl: 2   methocarbamol  (ROBAXIN ) 500 MG tablet, Take 1 tablet (500 mg total) by mouth 2 (two) times daily., Disp: 20 tablet, Rfl: 0   naloxone  (NARCAN ) nasal spray 4 mg/0.1 mL, Inhale 1 spray nasally one time ; may repeat dose in alternate nostril every 2-3 minutes until becomes responsive or EMS arrives, Disp: 2 each, Rfl: 3   oxyCODONE -acetaminophen  (PERCOCET) 5-325 MG tablet, Take 1 tablet by mouth every 8 (eight) hours as needed for severe pain., Disp: 6 tablet, Rfl: 0   sildenafil  (VIAGRA ) 100 MG tablet, Take 1 tablet (100 mg total) by mouth daily as needed for erectile dysfunction., Disp: 10 tablet, Rfl: 5   Medications ordered in this encounter:  No orders of the defined types were placed in this encounter.    *If you need refills on other medications prior to your next appointment, please contact your pharmacy*  Follow-Up: Call back or seek an in-person evaluation if the symptoms worsen or if the condition fails to  improve as anticipated.  [facility] [phone]    If you have been instructed to have an in-person evaluation today at a local Urgent Care facility, please use the link below. It will take you to a list of all of our available [facility], including address, phone number and hours of operation. Please do not delay care.  [facility]  If you or a family member do not have a primary care provider, use the link below to schedule a visit and establish care. When you choose a [facility] primary care physician or advanced practice provider, you gain a long-term partner in health. Find a Primary Care Provider  Learn more about [facility] in-office and virtual care options: [facility] - Get Care Now "
